# Patient Record
Sex: Male | Born: 1960 | Race: Black or African American | Hispanic: No | Marital: Single | Smoking: Former smoker
Health system: Southern US, Community
[De-identification: ages and names within clinical notes are randomized; demographics above are authoritative.]

## PROBLEM LIST (undated history)

## (undated) DIAGNOSIS — A4902 Methicillin resistant Staphylococcus aureus infection, unspecified site: Secondary | ICD-10-CM

## (undated) DIAGNOSIS — E119 Type 2 diabetes mellitus without complications: Secondary | ICD-10-CM

## (undated) DIAGNOSIS — M1990 Unspecified osteoarthritis, unspecified site: Secondary | ICD-10-CM

## (undated) DIAGNOSIS — I10 Essential (primary) hypertension: Secondary | ICD-10-CM

## (undated) DIAGNOSIS — D649 Anemia, unspecified: Secondary | ICD-10-CM

## (undated) HISTORY — PX: BUNIONECTOMY: SHX129

## (undated) HISTORY — PX: EYE SURGERY: SHX253

## (undated) HISTORY — PX: UMBILICAL HERNIA REPAIR: SHX196

## (undated) HISTORY — PX: AMPUTATION TOE: SHX6595

---

## 1997-11-02 ENCOUNTER — Emergency Department (HOSPITAL_COMMUNITY): Admission: EM | Admit: 1997-11-02 | Discharge: 1997-11-02 | Payer: Self-pay | Admitting: Emergency Medicine

## 1998-01-16 ENCOUNTER — Emergency Department (HOSPITAL_COMMUNITY): Admission: EM | Admit: 1998-01-16 | Discharge: 1998-01-16 | Payer: Self-pay | Admitting: Emergency Medicine

## 1998-01-17 ENCOUNTER — Inpatient Hospital Stay (HOSPITAL_COMMUNITY): Admission: EM | Admit: 1998-01-17 | Discharge: 1998-01-18 | Payer: Self-pay | Admitting: Emergency Medicine

## 1998-01-21 ENCOUNTER — Emergency Department (HOSPITAL_COMMUNITY): Admission: EM | Admit: 1998-01-21 | Discharge: 1998-01-21 | Payer: Self-pay | Admitting: Emergency Medicine

## 1998-02-21 ENCOUNTER — Emergency Department (HOSPITAL_COMMUNITY): Admission: EM | Admit: 1998-02-21 | Discharge: 1998-02-22 | Payer: Self-pay | Admitting: Emergency Medicine

## 1998-09-23 ENCOUNTER — Emergency Department (HOSPITAL_COMMUNITY): Admission: EM | Admit: 1998-09-23 | Discharge: 1998-09-23 | Payer: Self-pay | Admitting: Emergency Medicine

## 1999-05-06 ENCOUNTER — Emergency Department (HOSPITAL_COMMUNITY): Admission: EM | Admit: 1999-05-06 | Discharge: 1999-05-06 | Payer: Self-pay | Admitting: Emergency Medicine

## 1999-05-07 ENCOUNTER — Emergency Department (HOSPITAL_COMMUNITY): Admission: EM | Admit: 1999-05-07 | Discharge: 1999-05-07 | Payer: Self-pay | Admitting: Emergency Medicine

## 2008-03-05 ENCOUNTER — Emergency Department (HOSPITAL_COMMUNITY): Admission: EM | Admit: 2008-03-05 | Discharge: 2008-03-05 | Payer: Self-pay | Admitting: Emergency Medicine

## 2008-07-22 ENCOUNTER — Emergency Department (HOSPITAL_COMMUNITY): Admission: EM | Admit: 2008-07-22 | Discharge: 2008-07-22 | Payer: Self-pay | Admitting: Emergency Medicine

## 2008-09-16 ENCOUNTER — Emergency Department (HOSPITAL_COMMUNITY): Admission: EM | Admit: 2008-09-16 | Discharge: 2008-09-16 | Payer: Self-pay | Admitting: Emergency Medicine

## 2010-02-15 DEATH — deceased

## 2010-06-02 ENCOUNTER — Emergency Department (HOSPITAL_COMMUNITY): Admission: EM | Admit: 2010-06-02 | Discharge: 2010-06-03 | Payer: Self-pay | Admitting: Emergency Medicine

## 2010-09-28 LAB — ETHANOL: Alcohol, Ethyl (B): 226 mg/dL — ABNORMAL HIGH (ref 0–10)

## 2010-09-28 LAB — COMPREHENSIVE METABOLIC PANEL
ALT: 24 U/L (ref 0–53)
AST: 42 U/L — ABNORMAL HIGH (ref 0–37)
Albumin: 4.4 g/dL (ref 3.5–5.2)
Alkaline Phosphatase: 57 U/L (ref 39–117)
CO2: 20 mEq/L (ref 19–32)
Chloride: 105 mEq/L (ref 96–112)
Creatinine, Ser: 1.18 mg/dL (ref 0.4–1.5)
GFR calc Af Amer: >60 mL/min (ref >60)
GFR calc non Af Amer: >60 mL/min (ref >60)
Potassium: 3.9 mEq/L (ref 3.5–5.1)
Sodium: 140 mEq/L (ref 135–145)
Total Bilirubin: 0.3 mg/dL (ref 0.3–1.2)

## 2010-09-28 LAB — RAPID URINE DRUG SCREEN, HOSP PERFORMED
Amphetamines: NOT DETECTED
Cocaine: NOT DETECTED
Opiates: NOT DETECTED
Tetrahydrocannabinol: NOT DETECTED

## 2010-09-28 LAB — CBC
Hemoglobin: 13.6 g/dL (ref 13.0–17.0)
Platelets: 237 10*3/uL (ref 150–400)
RBC: 4.57 MIL/uL (ref 4.22–5.81)
WBC: 8.9 10*3/uL (ref 4.0–10.5)

## 2010-09-28 LAB — URINALYSIS, ROUTINE W REFLEX MICROSCOPIC
Bilirubin Urine: NEGATIVE
Hgb urine dipstick: NEGATIVE
Ketones, ur: NEGATIVE mg/dL
Specific Gravity, Urine: 1.009 (ref 1.005–1.030)
Urobilinogen, UA: 0.2 mg/dL (ref 0.0–1.0)

## 2010-09-28 LAB — DIFFERENTIAL
Basophils Absolute: 0 10*3/uL (ref 0.0–0.1)
Eosinophils Absolute: 0.1 10*3/uL (ref 0.0–0.7)
Eosinophils Relative: 1 % (ref 0–5)
Lymphocytes Relative: 16 % (ref 12–46)
Monocytes Absolute: 0.2 10*3/uL (ref 0.1–1.0)

## 2010-09-28 LAB — GLUCOSE, CAPILLARY: Glucose-Capillary: 69 mg/dL — ABNORMAL LOW (ref 70–99)

## 2010-10-28 LAB — CBC
HCT: 40.6 % (ref 39.0–52.0)
MCHC: 34.4 g/dL (ref 30.0–36.0)
MCV: 87.6 fL (ref 78.0–100.0)
Platelets: 237 10*3/uL (ref 150–400)
RDW: 14.3 % (ref 11.5–15.5)
WBC: 4.3 10*3/uL (ref 4.0–10.5)

## 2010-10-28 LAB — ETHANOL: Alcohol, Ethyl (B): 7 mg/dL (ref 0–10)

## 2010-10-28 LAB — RAPID URINE DRUG SCREEN, HOSP PERFORMED
Amphetamines: NOT DETECTED
Barbiturates: NOT DETECTED
Tetrahydrocannabinol: NOT DETECTED

## 2010-10-28 LAB — DIFFERENTIAL
Eosinophils Absolute: 0 10*3/uL (ref 0.0–0.7)
Eosinophils Relative: 1 % (ref 0–5)
Lymphs Abs: 1.3 10*3/uL (ref 0.7–4.0)

## 2010-10-28 LAB — BASIC METABOLIC PANEL
BUN: 6 mg/dL (ref 6–23)
CO2: 25 mEq/L (ref 19–32)
Chloride: 99 mEq/L (ref 96–112)
Glucose, Bld: 445 mg/dL — ABNORMAL HIGH (ref 70–99)
Potassium: 4.2 mEq/L (ref 3.5–5.1)

## 2010-10-28 LAB — GLUCOSE, CAPILLARY
Glucose-Capillary: 137 mg/dL — ABNORMAL HIGH (ref 70–99)
Glucose-Capillary: 332 mg/dL — ABNORMAL HIGH (ref 70–99)

## 2010-10-28 LAB — URINALYSIS, ROUTINE W REFLEX MICROSCOPIC
Leukocytes, UA: NEGATIVE
Nitrite: NEGATIVE
Specific Gravity, Urine: 1.012 (ref 1.005–1.030)
Urobilinogen, UA: 0.2 mg/dL (ref 0.0–1.0)

## 2010-10-28 LAB — URINE MICROSCOPIC-ADD ON

## 2010-11-01 LAB — DIFFERENTIAL
Eosinophils Relative: 2 % (ref 0–5)
Lymphocytes Relative: 24 % (ref 12–46)
Lymphs Abs: 1.5 10*3/uL (ref 0.7–4.0)
Monocytes Absolute: 0.4 10*3/uL (ref 0.1–1.0)

## 2010-11-01 LAB — BASIC METABOLIC PANEL
BUN: 21 mg/dL (ref 6–23)
Calcium: 9.6 mg/dL (ref 8.4–10.5)
GFR calc non Af Amer: >60 mL/min (ref >60)
Glucose, Bld: 162 mg/dL — ABNORMAL HIGH (ref 70–99)
Sodium: 134 mEq/L — ABNORMAL LOW (ref 135–145)

## 2010-11-01 LAB — CBC
HCT: 38.1 % — ABNORMAL LOW (ref 39.0–52.0)
Hemoglobin: 12.5 g/dL — ABNORMAL LOW (ref 13.0–17.0)
MCV: 89.6 fL (ref 78.0–100.0)
WBC: 6.2 10*3/uL (ref 4.0–10.5)

## 2010-11-01 LAB — POCT CARDIAC MARKERS: Troponin i, poc: <0.05 ng/mL (ref 0.00–0.09)

## 2010-11-11 ENCOUNTER — Emergency Department (HOSPITAL_COMMUNITY)
Admission: EM | Admit: 2010-11-11 | Discharge: 2010-11-11 | Disposition: A | Discharge status: Home / Self Care | Payer: Medicaid Other | Attending: Emergency Medicine | Admitting: Emergency Medicine

## 2010-11-11 ENCOUNTER — Emergency Department (HOSPITAL_COMMUNITY): Payer: Medicaid Other

## 2010-11-11 DIAGNOSIS — R112 Nausea with vomiting, unspecified: Secondary | ICD-10-CM | POA: Insufficient documentation

## 2010-11-11 DIAGNOSIS — I498 Other specified cardiac arrhythmias: Secondary | ICD-10-CM | POA: Insufficient documentation

## 2010-11-11 DIAGNOSIS — R05 Cough: Secondary | ICD-10-CM | POA: Insufficient documentation

## 2010-11-11 DIAGNOSIS — E119 Type 2 diabetes mellitus without complications: Secondary | ICD-10-CM | POA: Insufficient documentation

## 2010-11-11 DIAGNOSIS — I10 Essential (primary) hypertension: Secondary | ICD-10-CM | POA: Insufficient documentation

## 2010-11-11 DIAGNOSIS — J40 Bronchitis, not specified as acute or chronic: Secondary | ICD-10-CM | POA: Insufficient documentation

## 2010-11-11 DIAGNOSIS — J329 Chronic sinusitis, unspecified: Secondary | ICD-10-CM | POA: Insufficient documentation

## 2010-11-11 DIAGNOSIS — R059 Cough, unspecified: Secondary | ICD-10-CM | POA: Insufficient documentation

## 2010-11-11 LAB — GLUCOSE, CAPILLARY: Glucose-Capillary: 66 mg/dL — ABNORMAL LOW (ref 70–99)

## 2010-11-23 ENCOUNTER — Emergency Department (HOSPITAL_COMMUNITY)
Admission: EM | Admit: 2010-11-23 | Discharge: 2010-11-23 | Discharge status: Against Medical Advice | Payer: Medicaid Other | Attending: Emergency Medicine | Admitting: Emergency Medicine

## 2010-11-23 DIAGNOSIS — I10 Essential (primary) hypertension: Secondary | ICD-10-CM | POA: Insufficient documentation

## 2010-11-23 DIAGNOSIS — E119 Type 2 diabetes mellitus without complications: Secondary | ICD-10-CM | POA: Insufficient documentation

## 2010-11-23 DIAGNOSIS — Z7982 Long term (current) use of aspirin: Secondary | ICD-10-CM | POA: Insufficient documentation

## 2010-11-23 DIAGNOSIS — Z79899 Other long term (current) drug therapy: Secondary | ICD-10-CM | POA: Insufficient documentation

## 2010-11-23 DIAGNOSIS — F1010 Alcohol abuse, uncomplicated: Secondary | ICD-10-CM | POA: Insufficient documentation

## 2010-11-23 LAB — CBC
HCT: 36 % — ABNORMAL LOW (ref 39.0–52.0)
Hemoglobin: 12.3 g/dL — ABNORMAL LOW (ref 13.0–17.0)
RBC: 4.21 MIL/uL — ABNORMAL LOW (ref 4.22–5.81)
RDW: 14.5 % (ref 11.5–15.5)
WBC: 4.4 10*3/uL (ref 4.0–10.5)

## 2010-11-23 LAB — BASIC METABOLIC PANEL
CO2: 28 mEq/L (ref 19–32)
Calcium: 10 mg/dL (ref 8.4–10.5)
GFR calc Af Amer: >60 mL/min (ref >60)
Sodium: 139 mEq/L (ref 135–145)

## 2010-11-23 LAB — DIFFERENTIAL
Basophils Absolute: 0 10*3/uL (ref 0.0–0.1)
Eosinophils Relative: 2 % (ref 0–5)
Lymphocytes Relative: 36 % (ref 12–46)
Neutro Abs: 2.4 10*3/uL (ref 1.7–7.7)
Neutrophils Relative %: 55 % (ref 43–77)

## 2011-02-14 ENCOUNTER — Emergency Department (HOSPITAL_COMMUNITY): Payer: Medicaid Other

## 2011-02-14 ENCOUNTER — Emergency Department (HOSPITAL_COMMUNITY)
Admission: EM | Admit: 2011-02-14 | Discharge: 2011-02-14 | Disposition: A | Discharge status: Home / Self Care | Payer: Medicaid Other | Attending: Emergency Medicine | Admitting: Emergency Medicine

## 2011-02-14 DIAGNOSIS — E119 Type 2 diabetes mellitus without complications: Secondary | ICD-10-CM | POA: Insufficient documentation

## 2011-02-14 DIAGNOSIS — Z79899 Other long term (current) drug therapy: Secondary | ICD-10-CM | POA: Insufficient documentation

## 2011-02-14 DIAGNOSIS — I10 Essential (primary) hypertension: Secondary | ICD-10-CM | POA: Insufficient documentation

## 2011-02-14 DIAGNOSIS — R05 Cough: Secondary | ICD-10-CM | POA: Insufficient documentation

## 2011-02-14 DIAGNOSIS — R059 Cough, unspecified: Secondary | ICD-10-CM | POA: Insufficient documentation

## 2011-03-25 ENCOUNTER — Inpatient Hospital Stay (INDEPENDENT_AMBULATORY_CARE_PROVIDER_SITE_OTHER)
Admission: RE | Admit: 2011-03-25 | Discharge: 2011-03-25 | Disposition: A | Discharge status: Home / Self Care | Payer: Medicaid Other | Source: Ambulatory Visit | Attending: Emergency Medicine | Admitting: Emergency Medicine

## 2011-03-25 DIAGNOSIS — L03019 Cellulitis of unspecified finger: Secondary | ICD-10-CM

## 2011-03-28 LAB — CULTURE, ROUTINE-ABSCESS

## 2011-05-15 ENCOUNTER — Emergency Department (HOSPITAL_COMMUNITY)
Admission: EM | Admit: 2011-05-15 | Discharge: 2011-05-16 | Disposition: A | Discharge status: Home / Self Care | Payer: Medicaid Other | Attending: Emergency Medicine | Admitting: Emergency Medicine

## 2011-05-15 DIAGNOSIS — E119 Type 2 diabetes mellitus without complications: Secondary | ICD-10-CM | POA: Insufficient documentation

## 2011-05-15 DIAGNOSIS — F329 Major depressive disorder, single episode, unspecified: Secondary | ICD-10-CM | POA: Insufficient documentation

## 2011-05-15 DIAGNOSIS — F1020 Alcohol dependence, uncomplicated: Secondary | ICD-10-CM | POA: Insufficient documentation

## 2011-05-15 DIAGNOSIS — Z79899 Other long term (current) drug therapy: Secondary | ICD-10-CM | POA: Insufficient documentation

## 2011-05-15 DIAGNOSIS — I10 Essential (primary) hypertension: Secondary | ICD-10-CM | POA: Insufficient documentation

## 2011-05-15 DIAGNOSIS — Z7982 Long term (current) use of aspirin: Secondary | ICD-10-CM | POA: Insufficient documentation

## 2011-05-15 DIAGNOSIS — F3289 Other specified depressive episodes: Secondary | ICD-10-CM | POA: Insufficient documentation

## 2011-05-15 DIAGNOSIS — F1410 Cocaine abuse, uncomplicated: Secondary | ICD-10-CM | POA: Insufficient documentation

## 2011-05-15 LAB — RAPID URINE DRUG SCREEN, HOSP PERFORMED
Amphetamines: NOT DETECTED
Barbiturates: NOT DETECTED
Tetrahydrocannabinol: NOT DETECTED

## 2011-05-15 LAB — CBC
HCT: 37.9 % — ABNORMAL LOW (ref 39.0–52.0)
Hemoglobin: 13 g/dL (ref 13.0–17.0)
MCH: 28.6 pg (ref 26.0–34.0)
MCV: 83.5 fL (ref 78.0–100.0)
RBC: 4.54 MIL/uL (ref 4.22–5.81)

## 2011-05-15 LAB — COMPREHENSIVE METABOLIC PANEL
ALT: 15 U/L (ref 0–53)
AST: 25 U/L (ref 0–37)
Calcium: 10.4 mg/dL (ref 8.4–10.5)
GFR calc Af Amer: 84 mL/min — ABNORMAL LOW (ref >90)
Sodium: 138 mEq/L (ref 135–145)
Total Protein: 8 g/dL (ref 6.0–8.3)

## 2011-05-15 LAB — DIFFERENTIAL
Eosinophils Absolute: 0.1 10*3/uL (ref 0.0–0.7)
Lymphs Abs: 1.6 10*3/uL (ref 0.7–4.0)
Monocytes Relative: 6 % (ref 3–12)
Neutro Abs: 3.3 10*3/uL (ref 1.7–7.7)
Neutrophils Relative %: 62 % (ref 43–77)

## 2011-05-15 LAB — ETHANOL: Alcohol, Ethyl (B): 177 mg/dL — ABNORMAL HIGH (ref 0–11)

## 2011-05-16 ENCOUNTER — Inpatient Hospital Stay (HOSPITAL_COMMUNITY): Admission: AD | Admit: 2011-05-16 | Payer: Medicaid Other | Source: Ambulatory Visit | Admitting: Psychiatry

## 2011-05-16 LAB — GLUCOSE, CAPILLARY: Glucose-Capillary: 119 mg/dL — ABNORMAL HIGH (ref 70–99)

## 2011-05-17 ENCOUNTER — Emergency Department (HOSPITAL_COMMUNITY)
Admission: EM | Admit: 2011-05-17 | Discharge: 2011-05-17 | Disposition: A | Discharge status: Home / Self Care | Payer: Medicaid Other | Attending: Emergency Medicine | Admitting: Emergency Medicine

## 2011-05-17 DIAGNOSIS — E119 Type 2 diabetes mellitus without complications: Secondary | ICD-10-CM | POA: Insufficient documentation

## 2011-05-17 DIAGNOSIS — I10 Essential (primary) hypertension: Secondary | ICD-10-CM | POA: Insufficient documentation

## 2011-05-17 DIAGNOSIS — F1010 Alcohol abuse, uncomplicated: Secondary | ICD-10-CM | POA: Insufficient documentation

## 2011-05-17 LAB — POCT I-STAT, CHEM 8
BUN: 8 mg/dL (ref 6–23)
Creatinine, Ser: 1.4 mg/dL — ABNORMAL HIGH (ref 0.50–1.35)
Glucose, Bld: 258 mg/dL — ABNORMAL HIGH (ref 70–99)
Hemoglobin: 13.3 g/dL (ref 13.0–17.0)
Potassium: 4.2 mEq/L (ref 3.5–5.1)

## 2011-05-17 LAB — GLUCOSE, CAPILLARY: Glucose-Capillary: 231 mg/dL — ABNORMAL HIGH (ref 70–99)

## 2020-02-10 ENCOUNTER — Ambulatory Visit: Payer: No Typology Code available for payment source | Admitting: Allergy

## 2020-03-10 NOTE — Progress Notes (Signed)
New Patient Note  RE: RALPHEAL Figueroa MRN: [id number] DOB: [birth year] Date of Office Visit: 03/11/2020  Referring provider: Pecolia Ades, MD Primary care provider: [facility]  Chief Complaint: Allergic Rhinitis   History of Present Illness: I had the pleasure of seeing Conner Muegge for initial evaluation at the [facility] on 03/11/2020. He is a 59 y.o. male, who is referred here by [facility] for the evaluation of allergic rhinitis.  He reports symptoms of sneezing, coughing, rhinorrhea, voice hoarseness, itchy eyes. Symptoms have been going on for many years but worse the past year. The symptoms are present all year around with worsening in spring. Anosmia: no. Headache: no. He has used zyrtec 10mg  daily, prescription nasal spray 1 spray BID with some improvement in symptoms. No nosebleeds. Sinus infections: no. Previous work up includes: Blood work was positive to multiple items per patient report. Previous ENT evaluation: no. Previous sinus imaging: no. History of nasal polyps: no. Last eye exam: 3 months ago - patient gets injections for glaucoma. History of reflux: no.  Assessment and Plan: Cashton is a 59 y.o. male with: Other allergic rhinitis Perennial rhinoconjunctivitis symptoms for many years but worse the last year and during the spring.  Currently on Zyrtec 10 mg daily and some type of prescription nasal spray twice a day with some benefit.  No previous ENT evaluation.  He had blood work drawn which showed multiple allergens per patient's report.  Patient follows with ophthalmology for glaucoma injections.  Today's skin testing showed positive to grass, weed, ragweed, trees, mold, dust mites, dog.  Start environmental control measures as below.  May use over the counter antihistamines such as Zyrtec (cetirizine) 10mg  daily.   Had a detailed discussion with patient/family that clinical history is suggestive of allergic rhinitis,  and may benefit from allergy immunotherapy (AIT). Discussed in detail regarding the dosing, schedule, side effects (mild to moderate local allergic reaction and rarely systemic allergic reactions including anaphylaxis), and benefits (significant improvement in nasal symptoms, seasonal flares of asthma) of immunotherapy with the patient. There is significant time commitment involved with allergy shots, which includes weekly immunotherapy injections for first 9-12 months and then biweekly to monthly injections for 3-5 years. Consent was signed.   Start allergy injections.  I have prescribed epinephrine injectable and demonstrated proper use. For mild symptoms you can take over the counter antihistamines such as Benadryl and monitor symptoms closely. If symptoms worsen or if you have severe symptoms including breathing issues, throat closure, significant swelling, whole body hives, severe diarrhea and vomiting, lightheadedness then inject epinephrine and seek immediate medical care afterwards.  Advised patient to call with what nasal spray he is using at home. Given his glaucoma will avoid steroid nasal sprays.  Allergic conjunctivitis of both eyes  See assessment and plan as above for allergic rhinitis.  Return in about 6 months (around 09/11/2020).  Meds ordered this encounter  Medications  . EPINEPHrine 0.3 mg/0.3 mL IJ SOAJ injection    Sig: Inject 0.3 mLs (0.3 mg total) into the muscle as needed for anaphylaxis.    Dispense:  1 each    Refill:  2   Other allergy screening: Asthma: no Food allergy: no  Had issues with seafood and shellfish 8 years ago which caused pruritus.  Currently tolerating with no issues. Medication allergy: yes Hymenoptera allergy: no Urticaria: no Eczema:no History of recurrent infections suggestive of immunodeficency: no  Diagnostics: Skin Testing: Environmental allergy panel. Positive  to grass, weed, ragweed, trees, mold, dust mites, dog. Results discussed  with patient/family.  Airborne Adult Perc - 03/11/20 0939    Time Antigen Placed 3295    Allergen Manufacturer Waynette Buttery    Location Back    Number of Test 59    Panel 1 Select    1. Control-Buffer 50% Glycerol Negative    2. Control-Histamine 1 mg/ml 2+    3. Albumin saline Negative    4. Bahia 3+    5. [place] 4+    6. Johnson 4+    7. Kentucky Blue 3+    8. Meadow Fescue 3+    9. Perennial Rye 4+    10. Sweet Vernal 4+    11. Timothy 4+    12. Cocklebur Negative    13. Burweed Marshelder 2+    14. Ragweed, short 3+    15. Ragweed, Giant 2+    16. Plantain,  English 2+    17. Lamb's Quarters 2+    18. Sheep Sorrell 2+    19. Rough Pigweed 2+    20. Marsh Elder, Rough Negative    21. Mugwort, Common 3+    22. Ash mix 3+    23. Birch mix 2+    24. Beech American 3+    25. Box, Elder 2+    26. Cedar, red 4+    27. Cottonwood, Eastern 2+    28. Elm mix 3+    29. Hickory Negative    30. Maple mix Negative    31. Oak, Guinea-[place]issau mix 3+    32. Pecan Pollen 4+    33. Pine mix 4+    34. Sycamore Eastern 4+    35. Walnut, Black Pollen 4+    36. Alternaria alternata Negative    37. Cladosporium Herbarum Negative    38. Aspergillus mix Negative    39. Penicillium mix Negative    40. Bipolaris sorokiniana (Helminthosporium) Negative    41. Drechslera spicifera (Curvularia) 2+    42. Mucor plumbeus Negative    43. Fusarium moniliforme 2+    44. Aureobasidium pullulans (pullulara) Negative    45. Rhizopus oryzae 2+    46. Botrytis cinera Negative    47. Epicoccum nigrum Negative    48. Phoma betae Negative    49. Candida Albicans Negative    50. Trichophyton mentagrophytes Negative    51. Mite, D Farinae  5,000 AU/ml 2+    52. Mite, D Pteronyssinus  5,000 AU/ml 2+    53. Cat Hair 10,000 BAU/ml Negative    54.  Dog Epithelia 2+    55. Mixed Feathers Negative    56. Horse Epithelia Negative    57. Cockroach, German Negative    58. Mouse Negative    59. Tobacco Leaf Negative           Intradermal - 03/11/20 1014    Time Antigen Placed 1014    Allergen Manufacturer Waynette Buttery    Location Arm    Number of Test 5    Control Negative    Mold 1 2+    Mold 2 Negative    Cat Negative    Cockroach Negative           Past Medical History: Patient Active Problem List   Diagnosis Date Noted  . Other allergic rhinitis 03/11/2020  . Allergic conjunctivitis of both eyes 03/11/2020   History reviewed. No pertinent past medical history. Past Surgical History: Past Surgical History:  Procedure Laterality Date  .  BUNIONECTOMY    . EYE SURGERY    . UMBILICAL HERNIA REPAIR     Medication List:  Current Outpatient Medications  Medication Sig Dispense Refill  . cetirizine (ZYRTEC) 10 MG tablet Take 10 mg by mouth daily.    Marland Kitchen. EPINEPHrine 0.3 mg/0.3 mL IJ SOAJ injection Inject 0.3 mLs (0.3 mg total) into the muscle as needed for anaphylaxis. 1 each 2   No current facility-administered medications for this visit.   Allergies: Allergies  Allergen Reactions  . Codeine Itching   Social History: Social History   Socioeconomic History  . Marital status: Single    Spouse name: Not on file  . Number of children: Not on file  . Years of education: Not on file  . Highest education level: Not on file  Occupational History  . Not on file  Tobacco Use  . Smoking status: Former Games developermoker  . Smokeless tobacco: Never Used  Vaping Use  . Vaping Use: Never used  Substance and Sexual Activity  . Alcohol use: Not Currently  . Drug use: Never  . Sexual activity: Not on file  Other Topics Concern  . Not on file  Social History Narrative  . Not on file   Social Determinants of Health   Financial Resource Strain:   . Difficulty of Paying Living Expenses: Not on file  Food Insecurity:   . Worried About Programme researcher, broadcasting/film/videounning Out of Food in the Last Year: Not on file  . Ran Out of Food in the Last Year: Not on file  Transportation Needs:   . Lack of Transportation (Medical): Not on  file  . Lack of Transportation (Non-Medical): Not on file  Physical Activity:   . Days of Exercise per Week: Not on file  . Minutes of Exercise per Session: Not on file  Stress:   . Feeling of Stress : Not on file  Social Connections:   . Frequency of Communication with Friends and Family: Not on file  . Frequency of Social Gatherings with Friends and Family: Not on file  . Attends Religious Services: Not on file  . Active Member of Clubs or Organizations: Not on file  . Attends BankerClub or Organization Meetings: Not on file  . Marital Status: Not on file   Lives in a 6[address] yea old home. Smoking: quit 8 years ago Occupation: retired  Landscape architectnvironmental History: ImmunologistWater Damage/mildew in the house: no Engineer, civil (consulting)Carpet in the family room: yes Carpet in the bedroom: yes Heating: electric Cooling: central Pet: no  Family History: History reviewed. No pertinent family history. Problem                               Relation Asthma                                   Mother  Eczema                                No  Food allergy                          No  Allergic rhino conjunctivitis     Mother, brother, niece  Review of Systems  Constitutional: Negative for appetite change, chills, fever and unexpected weight change.  HENT: Positive for  congestion, postnasal drip and rhinorrhea.   Eyes: Positive for itching.  Respiratory: Positive for cough. Negative for chest tightness, shortness of breath and wheezing.   Cardiovascular: Negative for chest pain.  Gastrointestinal: Negative for abdominal pain.  Genitourinary: Negative for difficulty urinating.  Skin: Negative for rash.  Allergic/Immunologic: Positive for environmental allergies.  Neurological: Negative for headaches.   Objective: Pulse 98   Temp 98 F (36.7 C) (Temporal)   Resp 16   Ht 6\' 1"  (1.854 m)   Wt 185 lb 6.4 oz (84.1 kg)   SpO2 97%   BMI 24.46 kg/m  Body mass index is 24.46 kg/m. Physical Exam Vitals and nursing note reviewed.   Constitutional:      Appearance: Normal appearance. He is well-developed.  HENT:     Head: Normocephalic and atraumatic.     Right Ear: External ear normal.     Left Ear: External ear normal. There is impacted cerumen.     Nose: Nose normal.     Mouth/Throat:     Mouth: Mucous membranes are moist.     Pharynx: Oropharynx is clear.  Eyes:     Conjunctiva/sclera: Conjunctivae normal.  Cardiovascular:     Rate and Rhythm: Normal rate and regular rhythm.     Heart sounds: Normal heart sounds. No murmur heard.  No friction rub. No gallop.   Pulmonary:     Effort: Pulmonary effort is normal.     Breath sounds: Normal breath sounds. No wheezing, rhonchi or rales.  Abdominal:     Palpations: Abdomen is soft.  Musculoskeletal:     Cervical back: Neck supple.  Skin:    General: Skin is warm.     Findings: No rash.  Neurological:     Mental Status: He is alert and oriented to person, place, and time.  Psychiatric:        Behavior: Behavior normal.    The plan was reviewed with the patient/family, and all questions/concerned were addressed.  It was my pleasure to see Blair today and participate in his care. Please feel free to contact me with any questions or concerns.  Sincerely,  Tinnie Gens, DO Allergy & Immunology  [facility] office: [phone] [facility] office: [phone] [place] office: [phone]

## 2020-03-11 ENCOUNTER — Ambulatory Visit (INDEPENDENT_AMBULATORY_CARE_PROVIDER_SITE_OTHER): Payer: No Typology Code available for payment source | Admitting: Allergy

## 2020-03-11 ENCOUNTER — Encounter: Payer: Self-pay | Admitting: Allergy

## 2020-03-11 ENCOUNTER — Other Ambulatory Visit: Payer: Self-pay

## 2020-03-11 ENCOUNTER — Telehealth: Payer: Self-pay | Admitting: Allergy

## 2020-03-11 VITALS — HR 98 | Temp 98.0°F | Resp 16 | Ht 73.0 in | Wt 185.4 lb

## 2020-03-11 DIAGNOSIS — H1013 Acute atopic conjunctivitis, bilateral: Secondary | ICD-10-CM | POA: Diagnosis not present

## 2020-03-11 DIAGNOSIS — J3089 Other allergic rhinitis: Secondary | ICD-10-CM | POA: Diagnosis not present

## 2020-03-11 MED ORDER — EPINEPHRINE 0.3 MG/0.3ML IJ SOAJ: 0.3000 mg | INTRAMUSCULAR | 2 refills | Status: DC | PRN

## 2020-03-11 NOTE — Assessment & Plan Note (Addendum)
Perennial rhinoconjunctivitis symptoms for many years but worse the last year and during the spring.  Currently on Zyrtec 10 mg daily and some type of prescription nasal spray twice a day with some benefit.  No previous ENT evaluation.  He had blood work drawn which showed multiple allergens per patient's report.  Patient follows with ophthalmology for glaucoma injections.  Today's skin testing showed positive to grass, weed, ragweed, trees, mold, dust mites, dog.  Start environmental control measures as below.  May use over the counter antihistamines such as Zyrtec (cetirizine) 10mg  daily.   Had a detailed discussion with patient/family that clinical history is suggestive of allergic rhinitis, and may benefit from allergy immunotherapy (AIT). Discussed in detail regarding the dosing, schedule, side effects (mild to moderate local allergic reaction and rarely systemic allergic reactions including anaphylaxis), and benefits (significant improvement in nasal symptoms, seasonal flares of asthma) of immunotherapy with the patient. There is significant time commitment involved with allergy shots, which includes weekly immunotherapy injections for first 9-12 months and then biweekly to monthly injections for 3-5 years. Consent was signed.   Start allergy injections.  I have prescribed epinephrine injectable and demonstrated proper use. For mild symptoms you can take over the counter antihistamines such as Benadryl and monitor symptoms closely. If symptoms worsen or if you have severe symptoms including breathing issues, throat closure, significant swelling, whole body hives, severe diarrhea and vomiting, lightheadedness then inject epinephrine and seek immediate medical care afterwards.  Advised patient to call with what nasal spray he is using at home. Given his glaucoma will avoid steroid nasal sprays.

## 2020-03-11 NOTE — Telephone Encounter (Signed)
Patient called back to let you know that he is taking ipratropium nasal  spray. [phone]

## 2020-03-11 NOTE — Progress Notes (Signed)
VIALS EXP 03-11-21 °

## 2020-03-11 NOTE — Assessment & Plan Note (Signed)
   See assessment and plan as above for allergic rhinitis.  

## 2020-03-11 NOTE — Patient Instructions (Addendum)
Let me know what nasal spray you use at home.   Today's skin testing showed: Positive to grass, weed, ragweed, trees, mold, dust mites, dog.  Environmental allergies  Start environmental control measures as below.  May use over the counter antihistamines such as Zyrtec (cetirizine) 10mg  daily.   Had a detailed discussion with patient/family that clinical history is suggestive of allergic rhinitis, and may benefit from allergy immunotherapy (AIT). Discussed in detail regarding the dosing, schedule, side effects (mild to moderate local allergic reaction and rarely systemic allergic reactions including anaphylaxis), and benefits (significant improvement in nasal symptoms, seasonal flares of asthma) of immunotherapy with the patient. There is significant time commitment involved with allergy shots, which includes weekly immunotherapy injections for first 9-12 months and then biweekly to monthly injections for 3-5 years. Consent was signed.   Start allergy injections.  I have prescribed epinephrine injectable and demonstrated proper use. For mild symptoms you can take over the counter antihistamines such as Benadryl and monitor symptoms closely. If symptoms worsen or if you have severe symptoms including breathing issues, throat closure, significant swelling, whole body hives, severe diarrhea and vomiting, lightheadedness then inject epinephrine and seek immediate medical care afterwards.  Follow up in 6 months or sooner if needed.  Follow up in 3 weeks for first injection.  [place] office: [phone]  Reducing Pollen Exposure . Pollen seasons: trees (spring), grass (summer) and ragweed/weeds (fall). 11[birth year]-1980 Keep windows closed in your home and car to lower pollen exposure.  Marland Kitchen air conditioning in the bedroom and throughout the house if possible.  . Avoid going out in dry windy days - especially early morning. . Pollen counts are highest between 5 - 10 AM and on dry, hot and windy days.   . Save outside activities for late afternoon or after a heavy rain, when pollen levels are lower.  . Avoid mowing of grass if you have grass pollen allergy. Lilian Kapur Be aware that pollen can also be transported indoors on people and pets.  . Dry your clothes in an automatic dryer rather than hanging them outside where they might collect pollen.  . Rinse hair and eyes before bedtime. Mold Control . Mold and fungi can grow on a variety of surfaces provided certain temperature and moisture conditions exist.  . Outdoor molds grow on plants, decaying vegetation and soil. The major outdoor mold, Alternaria and Cladosporium, are found in very high numbers during hot and dry conditions. Generally, a late summer - fall peak is seen for common outdoor fungal spores. Rain will temporarily lower outdoor mold spore count, but counts rise rapidly when the rainy period ends. . The most important indoor molds are Aspergillus and Penicillium. Dark, humid and poorly ventilated basements are ideal sites for mold growth. The next most common sites of mold growth are the bathroom and the kitchen. Outdoor (Seasonal) Mold Control . Use air conditioning and keep windows closed. . Avoid exposure to decaying vegetation. 05-[birth year]-1976 Avoid leaf raking. . Avoid grain handling. . Consider wearing a face mask if working in moldy areas.  Indoor (Perennial) Mold Control  . Maintain humidity below 50%. . Get rid of mold growth on hard surfaces with water, detergent and, if necessary, 5% bleach (do not mix with other cleaners). Then dry the area completely. If mold covers an area more than 10 square feet, consider hiring an indoor environmental professional. . For clothing, washing with soap and water is best. If moldy items cannot be cleaned and dried, throw them away. Marland Kitchen  Remove sources e.g. contaminated carpets. . Repair and seal leaking roofs or pipes. Using dehumidifiers in damp basements may be helpful, but empty the water and clean units  regularly to prevent mildew from forming. All rooms, especially basements, bathrooms and kitchens, require ventilation and cleaning to deter mold and mildew growth. Avoid carpeting on concrete or damp floors, and storing items in damp areas. Control of House Dust Mite Allergen . Dust mite allergens are a common trigger of allergy and asthma symptoms. While they can be found throughout the house, these microscopic creatures thrive in warm, humid environments such as bedding, upholstered furniture and carpeting. . Because so much time is spent in the bedroom, it is essential to reduce mite levels there.  . Encase pillows, mattresses, and box springs in special allergen-proof fabric covers or airtight, zippered plastic covers.  . Bedding should be washed weekly in hot water (130 F) and dried in a hot dryer. Allergen-proof covers are available for comforters and pillows that can't be regularly washed.  Reyes Ivan the allergy-proof covers every few months. Minimize clutter in the bedroom. Keep pets out of the bedroom.  Marland Kitchen Keep humidity less than 50% by using a dehumidifier or air conditioning. You can buy a humidity measuring device called a hygrometer to monitor this.  . If possible, replace carpets with hardwood, linoleum, or washable area rugs. If that's not possible, vacuum frequently with a vacuum that has a HEPA filter. . Remove all upholstered furniture and non-washable window drapes from the bedroom. . Remove all non-washable stuffed toys from the bedroom.  Wash stuffed toys weekly. Pet Allergen Avoidance: . Contrary to popular opinion, there are no "hypoallergenic" breeds of dogs or cats. That is because people are not allergic to an animal's hair, but to an allergen found in the animal's saliva, dander (dead skin flakes) or urine. Pet allergy symptoms typically occur within minutes. For some people, symptoms can build up and become most severe 8 to 12 hours after contact with the animal. People with  severe allergies can experience reactions in public places if dander has been transported on the pet owners' clothing. Marland Kitchen Keeping an animal outdoors is only a partial solution, since homes with pets in the yard still have higher concentrations of animal allergens. . Before getting a pet, ask your allergist to determine if you are allergic to animals. If your pet is already considered part of your family, try to minimize contact and keep the pet out of the bedroom and other rooms where you spend a great deal of time. . As with dust mites, vacuum carpets often or replace carpet with a hardwood floor, tile or linoleum. . High-efficiency particulate air (HEPA) cleaners can reduce allergen levels over time. . While dander and saliva are the source of cat and dog allergens, urine is the source of allergens from rabbits, hamsters, mice and [place] pigs; so ask a non-allergic family member to clean the animal's cage. . If you have a pet allergy, talk to your allergist about the potential for allergy immunotherapy (allergy shots). This strategy can often provide long-term relief.

## 2020-03-11 NOTE — Telephone Encounter (Signed)
Was unable to communicate attempted several times, 1st time disconnected, 2nd  time number not in service, 3rd time disconnected.

## 2020-03-12 DIAGNOSIS — J301 Allergic rhinitis due to pollen: Secondary | ICD-10-CM | POA: Diagnosis not present

## 2020-03-12 NOTE — Telephone Encounter (Signed)
Called and spoke with patient and he wanted to just let Dr. Selena Batten know that he was taking ipratropium nasal spray.

## 2020-03-12 NOTE — Telephone Encounter (Signed)
Okay that's fine to take as prescribed.

## 2020-03-16 DIAGNOSIS — J3089 Other allergic rhinitis: Secondary | ICD-10-CM

## 2020-04-01 ENCOUNTER — Ambulatory Visit (INDEPENDENT_AMBULATORY_CARE_PROVIDER_SITE_OTHER): Payer: No Typology Code available for payment source | Admitting: *Deleted

## 2020-04-01 ENCOUNTER — Other Ambulatory Visit: Payer: Self-pay

## 2020-04-01 DIAGNOSIS — J309 Allergic rhinitis, unspecified: Secondary | ICD-10-CM | POA: Diagnosis not present

## 2020-04-01 NOTE — Progress Notes (Signed)
Immunotherapy   Patient Details  Name: Steven Figueroa MRN: [id number] Date of Birth: 19-Sep-1960  04/01/2020  Steven Figueroa started injections for  G-W-RW_T, M-DM-D Following schedule: A  Frequency:1 time per week Epi-Pen:Epi-Pen Available  Consent signed and patient instructions given. Patient started allergy injections and receive .65mL of G-W-RW-T in the RUA and 0.7mL of M-DM-D in the LUA. Patient waited 30 minutes and did not experience any issues.    Kirby Cortese Fernandez-Vernon 04/01/2020, 11:05 AM

## 2020-04-07 ENCOUNTER — Ambulatory Visit (INDEPENDENT_AMBULATORY_CARE_PROVIDER_SITE_OTHER): Payer: No Typology Code available for payment source | Admitting: *Deleted

## 2020-04-07 DIAGNOSIS — J309 Allergic rhinitis, unspecified: Secondary | ICD-10-CM

## 2020-04-15 ENCOUNTER — Ambulatory Visit (INDEPENDENT_AMBULATORY_CARE_PROVIDER_SITE_OTHER): Payer: No Typology Code available for payment source

## 2020-04-15 DIAGNOSIS — J309 Allergic rhinitis, unspecified: Secondary | ICD-10-CM

## 2020-04-21 ENCOUNTER — Ambulatory Visit (INDEPENDENT_AMBULATORY_CARE_PROVIDER_SITE_OTHER): Payer: No Typology Code available for payment source

## 2020-04-21 DIAGNOSIS — J309 Allergic rhinitis, unspecified: Secondary | ICD-10-CM

## 2020-04-28 ENCOUNTER — Ambulatory Visit (INDEPENDENT_AMBULATORY_CARE_PROVIDER_SITE_OTHER): Payer: No Typology Code available for payment source

## 2020-04-28 DIAGNOSIS — J309 Allergic rhinitis, unspecified: Secondary | ICD-10-CM | POA: Diagnosis not present

## 2020-05-06 ENCOUNTER — Ambulatory Visit (INDEPENDENT_AMBULATORY_CARE_PROVIDER_SITE_OTHER): Payer: No Typology Code available for payment source | Admitting: *Deleted

## 2020-05-06 DIAGNOSIS — J309 Allergic rhinitis, unspecified: Secondary | ICD-10-CM

## 2020-05-14 ENCOUNTER — Ambulatory Visit (INDEPENDENT_AMBULATORY_CARE_PROVIDER_SITE_OTHER): Payer: No Typology Code available for payment source

## 2020-05-14 DIAGNOSIS — J309 Allergic rhinitis, unspecified: Secondary | ICD-10-CM

## 2020-05-29 ENCOUNTER — Ambulatory Visit (INDEPENDENT_AMBULATORY_CARE_PROVIDER_SITE_OTHER): Payer: No Typology Code available for payment source

## 2020-05-29 DIAGNOSIS — J309 Allergic rhinitis, unspecified: Secondary | ICD-10-CM | POA: Diagnosis not present

## 2020-06-02 ENCOUNTER — Ambulatory Visit (INDEPENDENT_AMBULATORY_CARE_PROVIDER_SITE_OTHER): Payer: No Typology Code available for payment source

## 2020-06-02 DIAGNOSIS — J309 Allergic rhinitis, unspecified: Secondary | ICD-10-CM

## 2020-06-09 ENCOUNTER — Ambulatory Visit (INDEPENDENT_AMBULATORY_CARE_PROVIDER_SITE_OTHER): Payer: No Typology Code available for payment source | Admitting: *Deleted

## 2020-06-09 DIAGNOSIS — J309 Allergic rhinitis, unspecified: Secondary | ICD-10-CM

## 2020-06-15 ENCOUNTER — Ambulatory Visit (INDEPENDENT_AMBULATORY_CARE_PROVIDER_SITE_OTHER): Payer: No Typology Code available for payment source | Admitting: *Deleted

## 2020-06-15 DIAGNOSIS — J309 Allergic rhinitis, unspecified: Secondary | ICD-10-CM | POA: Diagnosis not present

## 2020-06-25 ENCOUNTER — Ambulatory Visit (INDEPENDENT_AMBULATORY_CARE_PROVIDER_SITE_OTHER): Payer: No Typology Code available for payment source

## 2020-06-25 DIAGNOSIS — J309 Allergic rhinitis, unspecified: Secondary | ICD-10-CM | POA: Diagnosis not present

## 2020-06-30 ENCOUNTER — Ambulatory Visit (INDEPENDENT_AMBULATORY_CARE_PROVIDER_SITE_OTHER): Payer: No Typology Code available for payment source

## 2020-06-30 DIAGNOSIS — J309 Allergic rhinitis, unspecified: Secondary | ICD-10-CM | POA: Diagnosis not present

## 2020-07-06 ENCOUNTER — Ambulatory Visit (INDEPENDENT_AMBULATORY_CARE_PROVIDER_SITE_OTHER): Payer: No Typology Code available for payment source

## 2020-07-06 DIAGNOSIS — J309 Allergic rhinitis, unspecified: Secondary | ICD-10-CM

## 2020-07-15 ENCOUNTER — Ambulatory Visit (INDEPENDENT_AMBULATORY_CARE_PROVIDER_SITE_OTHER): Payer: No Typology Code available for payment source

## 2020-07-15 DIAGNOSIS — J309 Allergic rhinitis, unspecified: Secondary | ICD-10-CM | POA: Diagnosis not present

## 2020-07-23 ENCOUNTER — Ambulatory Visit (INDEPENDENT_AMBULATORY_CARE_PROVIDER_SITE_OTHER): Payer: No Typology Code available for payment source

## 2020-07-23 DIAGNOSIS — J309 Allergic rhinitis, unspecified: Secondary | ICD-10-CM

## 2020-07-31 ENCOUNTER — Ambulatory Visit (INDEPENDENT_AMBULATORY_CARE_PROVIDER_SITE_OTHER): Payer: No Typology Code available for payment source | Admitting: *Deleted

## 2020-07-31 DIAGNOSIS — J309 Allergic rhinitis, unspecified: Secondary | ICD-10-CM

## 2020-08-10 ENCOUNTER — Ambulatory Visit (INDEPENDENT_AMBULATORY_CARE_PROVIDER_SITE_OTHER): Payer: No Typology Code available for payment source

## 2020-08-10 DIAGNOSIS — J309 Allergic rhinitis, unspecified: Secondary | ICD-10-CM

## 2020-08-20 ENCOUNTER — Ambulatory Visit (INDEPENDENT_AMBULATORY_CARE_PROVIDER_SITE_OTHER): Payer: No Typology Code available for payment source

## 2020-08-20 DIAGNOSIS — J309 Allergic rhinitis, unspecified: Secondary | ICD-10-CM

## 2020-08-25 ENCOUNTER — Ambulatory Visit (INDEPENDENT_AMBULATORY_CARE_PROVIDER_SITE_OTHER): Payer: No Typology Code available for payment source | Admitting: *Deleted

## 2020-08-25 DIAGNOSIS — J309 Allergic rhinitis, unspecified: Secondary | ICD-10-CM

## 2020-09-02 ENCOUNTER — Ambulatory Visit (INDEPENDENT_AMBULATORY_CARE_PROVIDER_SITE_OTHER): Payer: No Typology Code available for payment source | Admitting: *Deleted

## 2020-09-02 DIAGNOSIS — J309 Allergic rhinitis, unspecified: Secondary | ICD-10-CM

## 2020-09-07 ENCOUNTER — Ambulatory Visit: Payer: No Typology Code available for payment source | Admitting: Allergy

## 2020-09-09 ENCOUNTER — Ambulatory Visit (INDEPENDENT_AMBULATORY_CARE_PROVIDER_SITE_OTHER): Payer: No Typology Code available for payment source | Admitting: *Deleted

## 2020-09-09 DIAGNOSIS — J309 Allergic rhinitis, unspecified: Secondary | ICD-10-CM

## 2020-09-18 ENCOUNTER — Ambulatory Visit (INDEPENDENT_AMBULATORY_CARE_PROVIDER_SITE_OTHER): Payer: No Typology Code available for payment source | Admitting: *Deleted

## 2020-09-18 DIAGNOSIS — J309 Allergic rhinitis, unspecified: Secondary | ICD-10-CM | POA: Diagnosis not present

## 2020-09-23 ENCOUNTER — Ambulatory Visit (INDEPENDENT_AMBULATORY_CARE_PROVIDER_SITE_OTHER): Payer: No Typology Code available for payment source

## 2020-09-23 DIAGNOSIS — J309 Allergic rhinitis, unspecified: Secondary | ICD-10-CM

## 2020-09-24 NOTE — Progress Notes (Signed)
Follow Up Note  RE: Steven Figueroa MRN: [id number] DOB: [birth year] Date of Office Visit: 09/25/2020  Referring provider: Roswell Miners, MD Primary care provider: [facility]  Chief Complaint: Allergic Rhinitis  (Has started shots 6 months ago and no changes still has runny nose and sneezing everyday )  History of Present Illness: I had the pleasure of seeing Steven Figueroa for a follow up visit at the [facility] on 09/25/2020. He is a 60 y.o. male, who is being followed for allergic rhinoconjunctivitis on AIT. His previous allergy office visit was on 03/11/2020 with Dr. Selena Figueroa. Today is a regular follow up visit.  Allergic rhino conjunctivitis Started AIT on 04/01/2020 and symptoms are unchanged. Minimal localized itching at injection site.   Sneezing, rhinorrhea, itchy eyes daily still.   Currently taking allegra daily.  He is also apparently taking Flonase 3 nasal spray per nostril in the morning. No nosebleeds.  Patient has glaucoma and takes some type of eye drops for this.   Assessment and Plan: Steven Figueroa is a 60 y.o. male with: Seasonal and perennial allergic rhinoconjunctivitis Past history - Perennial rhinoconjunctivitis symptoms for many years but worse the last year and during the spring.  Currently on Zyrtec 10 mg daily and some type of prescription nasal spray twice a day with some benefit.  No previous ENT evaluation.  He had blood work drawn which showed multiple allergens per patient's report.  Patient follows with ophthalmology for glaucoma injections. 2021 skin testing showed positive to grass, weed, ragweed, trees, mold, dust mites, dog. Interim history - started AIT on 04/01/2020 (G-W-RW-T, M-DM-D) and no issues. Patient has not noticed any improvement in symptoms yet.   Continue environmental control measures as below.  May use over the counter antihistamines such as Allegra 180mg  daily. May take twice a day if needed.  May use azelastine  nasal spray 1-2 sprays per nostril twice a day as needed for runny nose/drainage.  STOP GREEN nasal spray - Flonase.  Given his glaucoma will avoid steroid nasal sprays.  Continue allergy injections.   Return in about 6 months (around 03/28/2021).  Meds ordered this encounter  Medications  . azelastine (ASTELIN) 0.1 % nasal spray    Sig: Place 1-2 sprays into both nostrils 2 (two) times daily as needed (runny nose). Use in each nostril as directed    Dispense:  30 mL    Refill:  5   Lab Orders  No laboratory test(s) ordered today    Diagnostics: None.  Medication List:  Current Outpatient Medications  Medication Sig Dispense Refill  . azelastine (ASTELIN) 0.1 % nasal spray Place 1-2 sprays into both nostrils 2 (two) times daily as needed (runny nose). Use in each nostril as directed 30 mL 5  . cetirizine (ZYRTEC) 10 MG tablet Take 10 mg by mouth daily.    05/28/2021 EPINEPHrine 0.3 mg/0.3 mL IJ SOAJ injection Inject 0.3 mLs (0.3 mg total) into the muscle as needed for anaphylaxis. 1 each 2  . glucose blood test strip USE ONE STRIP FOR TESTING BLOOD SUGAR CHECKS AS DIRECTED BY YOUR MEDICAL PROVIDER    . Omega-3 Fatty Acids (FISH OIL) 1000 MG CAPS Take 1 capsule by mouth 2 (two) times daily.    . Pediatric Multivitamins-Fl (MULTIVITAMINS/FL PO) Take 1 tablet by mouth daily.     No current facility-administered medications for this visit.   Allergies: Allergies  Allergen Reactions  . Codeine Itching  . Iodine  I reviewed his past medical history, social history, family history, and environmental history and no significant changes have been reported from his previous visit.  Review of Systems  Constitutional: Negative for appetite change, chills, fever and unexpected weight change.  HENT: Positive for congestion, postnasal drip and rhinorrhea.   Eyes: Positive for itching.  Respiratory: Negative for cough, chest tightness, shortness of breath and wheezing.   Cardiovascular:  Negative for chest pain.  Gastrointestinal: Negative for abdominal pain.  Genitourinary: Negative for difficulty urinating.  Skin: Negative for rash.  Allergic/Immunologic: Positive for environmental allergies.  Neurological: Negative for headaches.   Objective: BP 126/80   Pulse 98   Temp 97.9 F (36.6 C)   Resp 18   Ht 6\' 1"  (1.854 m)   Wt 188 lb (85.3 kg)   SpO2 96%   BMI 24.80 kg/m  Body mass index is 24.8 kg/m. Physical Exam Vitals and nursing note reviewed.  Constitutional:      Appearance: Normal appearance. He is well-developed.  HENT:     Head: Normocephalic and atraumatic.     Right Ear: Tympanic membrane and external ear normal.     Left Ear: Tympanic membrane and external ear normal.     Nose: Nose normal.     Mouth/Throat:     Mouth: Mucous membranes are moist.     Pharynx: Oropharynx is clear.  Eyes:     Conjunctiva/sclera: Conjunctivae normal.  Cardiovascular:     Rate and Rhythm: Normal rate and regular rhythm.     Heart sounds: Normal heart sounds. No murmur heard. No friction rub. No gallop.   Pulmonary:     Effort: Pulmonary effort is normal.     Breath sounds: Normal breath sounds. No wheezing, rhonchi or rales.  Musculoskeletal:     Cervical back: Neck supple.  Skin:    General: Skin is warm.     Findings: No rash.  Neurological:     Mental Status: He is alert and oriented to person, place, and time.  Psychiatric:        Behavior: Behavior normal.    Previous notes and tests were reviewed. The plan was reviewed with the patient/family, and all questions/concerned were addressed.  It was my pleasure to see Steven Figueroa today and participate in his care. Please feel free to contact me with any questions or concerns.  Sincerely,  Tinnie Gens, DO Allergy & Immunology  [facility] office: [phone] [facility] office: [phone]

## 2020-09-25 ENCOUNTER — Encounter: Payer: Self-pay | Admitting: Allergy

## 2020-09-25 ENCOUNTER — Other Ambulatory Visit: Payer: Self-pay

## 2020-09-25 ENCOUNTER — Ambulatory Visit (INDEPENDENT_AMBULATORY_CARE_PROVIDER_SITE_OTHER): Payer: No Typology Code available for payment source | Admitting: Allergy

## 2020-09-25 VITALS — BP 126/80 | HR 98 | Temp 97.9°F | Resp 18 | Ht 73.0 in | Wt 188.0 lb

## 2020-09-25 DIAGNOSIS — J3089 Other allergic rhinitis: Secondary | ICD-10-CM

## 2020-09-25 DIAGNOSIS — J302 Other seasonal allergic rhinitis: Secondary | ICD-10-CM

## 2020-09-25 DIAGNOSIS — H1013 Acute atopic conjunctivitis, bilateral: Secondary | ICD-10-CM | POA: Diagnosis not present

## 2020-09-25 DIAGNOSIS — H1010 Acute atopic conjunctivitis, unspecified eye: Secondary | ICD-10-CM | POA: Insufficient documentation

## 2020-09-25 MED ORDER — AZELASTINE HCL 0.1 % NA SOLN: 1.0000 | Freq: Two times a day (BID) | NASAL | 5 refills | Status: DC | PRN

## 2020-09-25 NOTE — Assessment & Plan Note (Signed)
Past history - Perennial rhinoconjunctivitis symptoms for many years but worse the last year and during the spring.  Currently on Zyrtec 10 mg daily and some type of prescription nasal spray twice a day with some benefit.  No previous ENT evaluation.  He had blood work drawn which showed multiple allergens per patient's report.  Patient follows with ophthalmology for glaucoma injections. 2021 skin testing showed positive to grass, weed, ragweed, trees, mold, dust mites, dog. Interim history - started AIT on 04/01/2020 (G-W-RW-T, M-DM-D) and no issues. Patient has not noticed any improvement in symptoms yet.   Continue environmental control measures as below.  May use over the counter antihistamines such as Allegra 180mg  daily. May take twice a day if needed.  May use azelastine nasal spray 1-2 sprays per nostril twice a day as needed for runny nose/drainage.  STOP GREEN nasal spray - Flonase.  Given his glaucoma will avoid steroid nasal sprays.  Continue allergy injections.

## 2020-09-25 NOTE — Patient Instructions (Addendum)
Environmental allergies  Past skin testing showed: Positive to grass, weed, ragweed, trees, mold, dust mites, dog.  Continue environmental control measures as below.  May use over the counter antihistamines such as Allegra 180mg  daily. May take twice a day if needed.  May use azelastine nasal spray 1-2 sprays per nostril twice a day as needed for runny nose/drainage.  STOP GREEN nasal spray - Flonase.  Continue allergy injections.   Follow up in 6 months or sooner if needed.   Reducing Pollen Exposure . Pollen seasons: trees (spring), grass (summer) and ragweed/weeds (fall). 11-24-1982 Keep windows closed in your home and car to lower pollen exposure.  Marland Kitchen air conditioning in the bedroom and throughout the house if possible.  . Avoid going out in dry windy days - especially early morning. . Pollen counts are highest between 5 - 10 AM and on dry, hot and windy days.  . Save outside activities for late afternoon or after a heavy rain, when pollen levels are lower.  . Avoid mowing of grass if you have grass pollen allergy. Lilian Kapur Be aware that pollen can also be transported indoors on people and pets.  . Dry your clothes in an automatic dryer rather than hanging them outside where they might collect pollen.  . Rinse hair and eyes before bedtime. Mold Control . Mold and fungi can grow on a variety of surfaces provided certain temperature and moisture conditions exist.  . Outdoor molds grow on plants, decaying vegetation and soil. The major outdoor mold, Alternaria and Cladosporium, are found in very high numbers during hot and dry conditions. Generally, a late summer - fall peak is seen for common outdoor fungal spores. Rain will temporarily lower outdoor mold spore count, but counts rise rapidly when the rainy period ends. . The most important indoor molds are Aspergillus and Penicillium. Dark, humid and poorly ventilated basements are ideal sites for mold growth. The next most common sites of  mold growth are the bathroom and the kitchen. Outdoor (Seasonal) Mold Control . Use air conditioning and keep windows closed. . Avoid exposure to decaying vegetation. 05-13-1978 Avoid leaf raking. . Avoid grain handling. . Consider wearing a face mask if working in moldy areas.  Indoor (Perennial) Mold Control  . Maintain humidity below 50%. . Get rid of mold growth on hard surfaces with water, detergent and, if necessary, 5% bleach (do not mix with other cleaners). Then dry the area completely. If mold covers an area more than 10 square feet, consider hiring an indoor environmental professional. . For clothing, washing with soap and water is best. If moldy items cannot be cleaned and dried, throw them away. . Remove sources e.g. contaminated carpets. . Repair and seal leaking roofs or pipes. Using dehumidifiers in damp basements may be helpful, but empty the water and clean units regularly to prevent mildew from forming. All rooms, especially basements, bathrooms and kitchens, require ventilation and cleaning to deter mold and mildew growth. Avoid carpeting on concrete or damp floors, and storing items in damp areas. Control of House Dust Mite Allergen . Dust mite allergens are a common trigger of allergy and asthma symptoms. While they can be found throughout the house, these microscopic creatures thrive in warm, humid environments such as bedding, upholstered furniture and carpeting. . Because so much time is spent in the bedroom, it is essential to reduce mite levels there.  . Encase pillows, mattresses, and box springs in special allergen-proof fabric covers or airtight, zippered plastic covers.  Marland Kitchen  Bedding should be washed weekly in hot water (130 F) and dried in a hot dryer. Allergen-proof covers are available for comforters and pillows that can't be regularly washed.  Reyes Ivan the allergy-proof covers every few months. Minimize clutter in the bedroom. Keep pets out of the bedroom.  Marland Kitchen Keep humidity  less than 50% by using a dehumidifier or air conditioning. You can buy a humidity measuring device called a hygrometer to monitor this.  . If possible, replace carpets with hardwood, linoleum, or washable area rugs. If that's not possible, vacuum frequently with a vacuum that has a HEPA filter. . Remove all upholstered furniture and non-washable window drapes from the bedroom. . Remove all non-washable stuffed toys from the bedroom.  Wash stuffed toys weekly. Pet Allergen Avoidance: . Contrary to popular opinion, there are no "hypoallergenic" breeds of dogs or cats. That is because people are not allergic to an animal's hair, but to an allergen found in the animal's saliva, dander (dead skin flakes) or urine. Pet allergy symptoms typically occur within minutes. For some people, symptoms can build up and become most severe 8 to 12 hours after contact with the animal. People with severe allergies can experience reactions in public places if dander has been transported on the pet owners' clothing. Marland Kitchen Keeping an animal outdoors is only a partial solution, since homes with pets in the yard still have higher concentrations of animal allergens. . Before getting a pet, ask your allergist to determine if you are allergic to animals. If your pet is already considered part of your family, try to minimize contact and keep the pet out of the bedroom and other rooms where you spend a great deal of time. . As with dust mites, vacuum carpets often or replace carpet with a hardwood floor, tile or linoleum. . High-efficiency particulate air (HEPA) cleaners can reduce allergen levels over time. . While dander and saliva are the source of cat and dog allergens, urine is the source of allergens from rabbits, hamsters, mice and [place] pigs; so ask a non-allergic family member to clean the animal's cage. . If you have a pet allergy, talk to your allergist about the potential for allergy immunotherapy (allergy shots). This  strategy can often provide long-term relief.

## 2020-09-30 ENCOUNTER — Ambulatory Visit (INDEPENDENT_AMBULATORY_CARE_PROVIDER_SITE_OTHER): Payer: No Typology Code available for payment source

## 2020-09-30 DIAGNOSIS — J309 Allergic rhinitis, unspecified: Secondary | ICD-10-CM | POA: Diagnosis not present

## 2020-10-08 ENCOUNTER — Ambulatory Visit (INDEPENDENT_AMBULATORY_CARE_PROVIDER_SITE_OTHER): Payer: No Typology Code available for payment source | Admitting: *Deleted

## 2020-10-08 DIAGNOSIS — J309 Allergic rhinitis, unspecified: Secondary | ICD-10-CM

## 2020-10-15 ENCOUNTER — Ambulatory Visit (INDEPENDENT_AMBULATORY_CARE_PROVIDER_SITE_OTHER): Payer: No Typology Code available for payment source | Admitting: *Deleted

## 2020-10-15 DIAGNOSIS — J309 Allergic rhinitis, unspecified: Secondary | ICD-10-CM

## 2020-10-22 ENCOUNTER — Ambulatory Visit (INDEPENDENT_AMBULATORY_CARE_PROVIDER_SITE_OTHER): Payer: No Typology Code available for payment source | Admitting: *Deleted

## 2020-10-22 DIAGNOSIS — J309 Allergic rhinitis, unspecified: Secondary | ICD-10-CM

## 2020-11-03 ENCOUNTER — Ambulatory Visit (INDEPENDENT_AMBULATORY_CARE_PROVIDER_SITE_OTHER): Payer: No Typology Code available for payment source | Admitting: *Deleted

## 2020-11-03 DIAGNOSIS — J309 Allergic rhinitis, unspecified: Secondary | ICD-10-CM | POA: Diagnosis not present

## 2020-11-13 ENCOUNTER — Ambulatory Visit (INDEPENDENT_AMBULATORY_CARE_PROVIDER_SITE_OTHER): Payer: No Typology Code available for payment source

## 2020-11-13 DIAGNOSIS — J309 Allergic rhinitis, unspecified: Secondary | ICD-10-CM

## 2020-11-19 ENCOUNTER — Ambulatory Visit (INDEPENDENT_AMBULATORY_CARE_PROVIDER_SITE_OTHER): Payer: No Typology Code available for payment source | Admitting: *Deleted

## 2020-11-19 DIAGNOSIS — J309 Allergic rhinitis, unspecified: Secondary | ICD-10-CM | POA: Diagnosis not present

## 2020-12-01 ENCOUNTER — Ambulatory Visit (INDEPENDENT_AMBULATORY_CARE_PROVIDER_SITE_OTHER): Payer: No Typology Code available for payment source | Admitting: *Deleted

## 2020-12-01 DIAGNOSIS — J309 Allergic rhinitis, unspecified: Secondary | ICD-10-CM

## 2020-12-10 ENCOUNTER — Ambulatory Visit (INDEPENDENT_AMBULATORY_CARE_PROVIDER_SITE_OTHER): Payer: No Typology Code available for payment source | Admitting: *Deleted

## 2020-12-10 DIAGNOSIS — J309 Allergic rhinitis, unspecified: Secondary | ICD-10-CM | POA: Diagnosis not present

## 2020-12-17 ENCOUNTER — Ambulatory Visit (INDEPENDENT_AMBULATORY_CARE_PROVIDER_SITE_OTHER): Payer: No Typology Code available for payment source | Admitting: *Deleted

## 2020-12-17 DIAGNOSIS — J309 Allergic rhinitis, unspecified: Secondary | ICD-10-CM | POA: Diagnosis not present

## 2020-12-22 ENCOUNTER — Ambulatory Visit (INDEPENDENT_AMBULATORY_CARE_PROVIDER_SITE_OTHER): Payer: No Typology Code available for payment source | Admitting: *Deleted

## 2020-12-22 DIAGNOSIS — J309 Allergic rhinitis, unspecified: Secondary | ICD-10-CM

## 2020-12-28 ENCOUNTER — Ambulatory Visit: Payer: Self-pay

## 2020-12-28 NOTE — Progress Notes (Signed)
error 

## 2020-12-31 ENCOUNTER — Ambulatory Visit (INDEPENDENT_AMBULATORY_CARE_PROVIDER_SITE_OTHER): Payer: No Typology Code available for payment source | Admitting: *Deleted

## 2020-12-31 DIAGNOSIS — J309 Allergic rhinitis, unspecified: Secondary | ICD-10-CM

## 2021-01-07 ENCOUNTER — Ambulatory Visit (INDEPENDENT_AMBULATORY_CARE_PROVIDER_SITE_OTHER): Payer: No Typology Code available for payment source | Admitting: *Deleted

## 2021-01-07 DIAGNOSIS — J309 Allergic rhinitis, unspecified: Secondary | ICD-10-CM

## 2021-01-20 ENCOUNTER — Ambulatory Visit (INDEPENDENT_AMBULATORY_CARE_PROVIDER_SITE_OTHER): Payer: No Typology Code available for payment source

## 2021-01-20 DIAGNOSIS — J309 Allergic rhinitis, unspecified: Secondary | ICD-10-CM | POA: Diagnosis not present

## 2021-02-03 ENCOUNTER — Ambulatory Visit (INDEPENDENT_AMBULATORY_CARE_PROVIDER_SITE_OTHER): Payer: No Typology Code available for payment source

## 2021-02-03 DIAGNOSIS — J309 Allergic rhinitis, unspecified: Secondary | ICD-10-CM | POA: Diagnosis not present

## 2021-02-11 ENCOUNTER — Ambulatory Visit (INDEPENDENT_AMBULATORY_CARE_PROVIDER_SITE_OTHER): Payer: No Typology Code available for payment source | Admitting: *Deleted

## 2021-02-11 DIAGNOSIS — J309 Allergic rhinitis, unspecified: Secondary | ICD-10-CM

## 2021-02-12 DIAGNOSIS — J301 Allergic rhinitis due to pollen: Secondary | ICD-10-CM

## 2021-02-15 DIAGNOSIS — J3089 Other allergic rhinitis: Secondary | ICD-10-CM | POA: Diagnosis not present

## 2021-02-15 NOTE — Progress Notes (Signed)
VIALS MADE. EXP 02-15-22 

## 2021-02-16 ENCOUNTER — Ambulatory Visit (INDEPENDENT_AMBULATORY_CARE_PROVIDER_SITE_OTHER): Payer: No Typology Code available for payment source

## 2021-02-16 DIAGNOSIS — J309 Allergic rhinitis, unspecified: Secondary | ICD-10-CM | POA: Diagnosis not present

## 2021-02-25 ENCOUNTER — Telehealth: Payer: Self-pay | Admitting: Allergy

## 2021-02-25 NOTE — Telephone Encounter (Signed)
New request for service faxed to [facility] [phone] and emailed to [email or web address].

## 2021-02-26 ENCOUNTER — Ambulatory Visit (INDEPENDENT_AMBULATORY_CARE_PROVIDER_SITE_OTHER): Payer: No Typology Code available for payment source

## 2021-02-26 DIAGNOSIS — J309 Allergic rhinitis, unspecified: Secondary | ICD-10-CM

## 2021-03-05 ENCOUNTER — Ambulatory Visit (INDEPENDENT_AMBULATORY_CARE_PROVIDER_SITE_OTHER): Payer: No Typology Code available for payment source

## 2021-03-05 DIAGNOSIS — J309 Allergic rhinitis, unspecified: Secondary | ICD-10-CM | POA: Diagnosis not present

## 2021-03-24 ENCOUNTER — Ambulatory Visit (INDEPENDENT_AMBULATORY_CARE_PROVIDER_SITE_OTHER): Payer: No Typology Code available for payment source

## 2021-03-24 DIAGNOSIS — J309 Allergic rhinitis, unspecified: Secondary | ICD-10-CM | POA: Diagnosis not present

## 2021-03-30 ENCOUNTER — Other Ambulatory Visit: Payer: Self-pay

## 2021-03-30 ENCOUNTER — Emergency Department
Admission: EM | Admit: 2021-03-30 | Discharge: 2021-03-30 | Disposition: A | Discharge status: Home / Self Care | Payer: No Typology Code available for payment source | Attending: Emergency Medicine | Admitting: Emergency Medicine

## 2021-03-30 ENCOUNTER — Emergency Department: Payer: No Typology Code available for payment source

## 2021-03-30 ENCOUNTER — Encounter: Payer: Self-pay | Admitting: Emergency Medicine

## 2021-03-30 DIAGNOSIS — S6992XA Unspecified injury of left wrist, hand and finger(s), initial encounter: Secondary | ICD-10-CM | POA: Diagnosis present

## 2021-03-30 DIAGNOSIS — W260XXA Contact with knife, initial encounter: Secondary | ICD-10-CM | POA: Diagnosis not present

## 2021-03-30 DIAGNOSIS — Z7984 Long term (current) use of oral hypoglycemic drugs: Secondary | ICD-10-CM | POA: Insufficient documentation

## 2021-03-30 DIAGNOSIS — Z79899 Other long term (current) drug therapy: Secondary | ICD-10-CM | POA: Diagnosis not present

## 2021-03-30 DIAGNOSIS — Z87891 Personal history of nicotine dependence: Secondary | ICD-10-CM | POA: Diagnosis not present

## 2021-03-30 DIAGNOSIS — S61012A Laceration without foreign body of left thumb without damage to nail, initial encounter: Secondary | ICD-10-CM | POA: Diagnosis not present

## 2021-03-30 DIAGNOSIS — Z7982 Long term (current) use of aspirin: Secondary | ICD-10-CM | POA: Diagnosis not present

## 2021-03-30 DIAGNOSIS — Y92000 Kitchen of unspecified non-institutional (private) residence as  the place of occurrence of the external cause: Secondary | ICD-10-CM | POA: Insufficient documentation

## 2021-03-30 MED ORDER — LIDOCAINE HCL (PF) 1 % IJ SOLN
5.0000 mL | Freq: Once | INTRAMUSCULAR | Status: AC
  Administered 2021-03-30: 5 mL
  Filled 2021-03-30: qty 5

## 2021-03-30 MED ORDER — BACITRACIN ZINC 500 UNIT/GM EX OINT
TOPICAL_OINTMENT | Freq: Once | CUTANEOUS | Status: AC
  Administered 2021-03-30: 1 via TOPICAL
  Filled 2021-03-30: qty 0.9

## 2021-03-30 NOTE — Discharge Instructions (Addendum)
Use Tylenol for pain and fevers.  Up to 1000 mg per dose, up to 4 times per day.  Do not take more than 4000 mg of Tylenol/acetaminophen within 24 hours..  In general, keep the area clean and dry. Then apply a Bacitracin/Neosporin type of antibiotic ointment.   Gently wash with warm soap and water once daily, and again if it gets dirty. Don't vigorously scrub at the wound.  Gently pat dry.  Once dry, apply Neosporin / bacitracin type antibiotic ointment. It is important to keep the wound moist with an antibiotic ointment, or a small amount of Vaseline, for the first 5 days. Keeping it moist/clean will help the area heal faster and prevent scar tissue formation.  If you are doing anything active, keep it covered.  If you are sitting around at home, you may leave it uncovered.

## 2021-03-30 NOTE — ED Provider Notes (Signed)
[facility] Emergency Department Provider Note ____________________________________________   Event Date/Time   First MD Initiated Contact with Patient 03/30/21 1812     (approximate)  I have reviewed the triage vital signs and the nursing notes.  HISTORY  Chief Complaint Laceration   HPI Steven Figueroa is a 60 y.o. malewho presents to the ED for evaluation of accidental thumb laceration  Chart review indicates no relevant history.  No thinners.  Patient presents to the ED, accompanied by his fiance, for evaluation of accidental left thumb laceration.  He reports cleaning multiple kitchen knives in his kitchen sink this evening when he accidentally sliced his left thumb.  Reports a single laceration, just prior to arrival, to his left thumb, just proximal to his nail.  Denies additional injuries.  He is right-hand dominant.  Reports it took a lot of bandages and tape for hemostasis.  Reports mild pain right now.  History reviewed. No pertinent past medical history.  Patient Active Problem List   Diagnosis Date Noted   Seasonal and perennial allergic rhinoconjunctivitis 09/25/2020    Past Surgical History:  Procedure Laterality Date   BUNIONECTOMY     EYE SURGERY     UMBILICAL HERNIA REPAIR      Prior to Admission medications   Medication Sig Start Date End Date Taking? Authorizing Provider  Alogliptin Benzoate 25 MG TABS Take 1 tablet by mouth daily. 05/02/20   [provider]  amLODipine (NORVASC) 10 MG tablet TAKE ONE TABLET BY MOUTH DAILY FOR BLOOD PRESSURE 11/08/19   [provider]  aspirin 81 MG EC tablet TAKE ONE TABLET BY MOUTH DAILY FOR HEART 04/30/20   [provider]  azelastine (ASTELIN) 0.1 % nasal spray Place 1-2 sprays into both nostrils 2 (two) times daily as needed (runny nose). Use in each nostril as directed 09/25/20   Ellamae Sia, DO  Brinzolamide-Brimonidine 1-0.2 % SUSP INSTILL 1 DROP IN BOTH EYES  THREE TIMES A DAY 12/12/19   [provider]  cetirizine (ZYRTEC) 10 MG tablet Take 10 mg by mouth daily.    [provider]  Cholecalciferol 50 MCG (2000 UT) TABS Take 2 tablets by mouth daily. 11/26/19   [provider]  EPINEPHrine 0.3 mg/0.3 mL IJ SOAJ injection Inject 0.3 mLs (0.3 mg total) into the muscle as needed for anaphylaxis. 03/11/20   Ellamae Sia, DO  ferrous sulfate 325 (65 FE) MG tablet Take 1 tablet by mouth every other day. 05/07/20   [provider]  gabapentin (NEURONTIN) 300 MG capsule TAKE ONE CAPSULE BY MOUTH IN THE MORNING AND TAKE ONE CAPSULE AT NOON AND TAKE TWO CAPSULES AT BEDTIME 01/14/20   [provider]  glipiZIDE (GLUCOTROL) 5 MG tablet TAKE ONE TABLET BY MOUTH TWICE A DAY BEFORE MEALS (TAKE THIS MEDICATION 30 MINUTES PRIOR TO A MEAL) FOR DIABETES. MAY CAUSE LOW BLOOD GLUCOSE. 10/21/19   [provider]  glucose blood test strip USE ONE STRIP FOR TESTING BLOOD SUGAR CHECKS AS DIRECTED BY YOUR MEDICAL PROVIDER 09/17/20   [provider]  latanoprost (XALATAN) 0.005 % ophthalmic solution INSTILL 1 DROP IN BOTH EYES AT BEDTIME 05/11/20   [provider]  losartan (COZAAR) 100 MG tablet Take 1 tablet by mouth daily. 01/12/20   [provider]  Magnesium Oxide 420 MG TABS TAKE ONE TABLET BY MOUTH TWICE A DAY FOR LOW MAGNESIUM 04/30/20   [provider]  metFORMIN (GLUCOPHAGE) 850 MG tablet TAKE ONE TABLET  BY MOUTH THREE TIMES A DAY WITH MEALS FOR DIABETES 02/03/20   [provider]  methazolamide (NEPTAZANE) 50 MG tablet Take 1 tablet by mouth 3 (three) times daily. 06/18/20   [provider]  modafinil (PROVIGIL) 200 MG tablet TAKE ONE TABLET BY MOUTH DAILY (EVERY MORNING) 09/02/20   [provider]  montelukast (SINGULAIR) 10 MG tablet Take 1 tablet by mouth daily. 11/18/19   [provider]  Omega-3 Fatty Acids (FISH OIL) 1000 MG CAPS Take 1 capsule by mouth 2  (two) times daily. 11/26/19   [provider]  Pediatric Multivitamins-Fl (MULTIVITAMINS/FL PO) Take 1 tablet by mouth daily. 02/06/20   [provider]  pioglitazone (ACTOS) 45 MG tablet TAKE ONE TABLET BY MOUTH DAILY FOR DIABETES 11/26/19   [provider]  pravastatin (PRAVACHOL) 20 MG tablet TAKE ONE TABLET BY MOUTH AT BEDTIME FOR CHOLESTEROL 11/26/19   [provider]  timolol (TIMOPTIC) 0.5 % ophthalmic solution INSTILL 1 DROP IN RIGHT EYE EVERY 12 HOURS 05/26/20   [provider]    Allergies Codeine and Iodine  No family history on file.  Social History Social History   Tobacco Use   Smoking status: Former   Smokeless tobacco: Never  Building services engineer Use: Never used  Substance Use Topics   Alcohol use: Not Currently   Drug use: Never    Review of Systems  Constitutional: No fever/chills Eyes: No visual changes. ENT: No sore throat. Cardiovascular: Denies chest pain. Respiratory: Denies shortness of breath. Gastrointestinal: No abdominal pain.  No nausea, no vomiting.  No diarrhea.  No constipation. Genitourinary: Negative for dysuria. Musculoskeletal: Negative for back pain. Positive for left thumb pain and laceration Skin: Negative for rash. Neurological: Negative for headaches, focal weakness or numbness.  ____________________________________________   PHYSICAL EXAM:  VITAL SIGNS: Vitals:   03/30/21 1600  BP: 121/82  Pulse: 98  Resp: 18  Temp: 98.6 F (37 C)  SpO2: 99%     Constitutional: Alert and oriented. Well appearing and in no acute distress. Eyes: Conjunctivae are normal. PERRL. EOMI. Head: Atraumatic. Nose: No congestion/rhinnorhea. Mouth/Throat: Mucous membranes are moist.  Oropharynx non-erythematous. Neck: No stridor. No cervical spine tenderness to palpation. Cardiovascular: Normal rate, regular rhythm. Grossly normal heart sounds.  Good peripheral circulation. Respiratory: Normal  respiratory effort.  No retractions. Lungs CTAB. Gastrointestinal: Soft , nondistended, nontender to palpation. No CVA tenderness. Musculoskeletal: No lower extremity tenderness nor edema.  No joint effusions.  Oblique 1.5 cm laceration to the dorsal aspect of the distal phalanx of the left thumb.  About a centimeter proximal to the base of the nail.  No evidence of nail injury.  No subungual hematoma.  Full active and passive ROM.  Hemostatic on arrival to the ED.  Some slow oozing with ranging. Neurologic:  Normal speech and language. No gross focal neurologic deficits are appreciated. No gait instability noted. Skin:  Skin is warm, dry and intact. No rash noted. Psychiatric: Mood and affect are normal. Speech and behavior are normal.  ____________________________________________   LABS (all labs ordered are listed, but only abnormal results are displayed)  Labs Reviewed - No data to display ____________________________________________  12 Lead EKG   ____________________________________________  RADIOLOGY  ED MD interpretation: Plain film of the left thumb reviewed by me without evidence of fracture or foreign body  Official radiology report(s): DG Finger Thumb Left  Result Date: 03/30/2021 CLINICAL DATA:  First digit laceration EXAM: LEFT THUMB 2+V COMPARISON:  None.  FINDINGS: Frontal, oblique, and lateral views of the left first digit are obtained. No fracture, subluxation, or dislocation. Joint spaces are well preserved. Soft tissues are unremarkable. No radiopaque foreign body. IMPRESSION: 1. No fracture or radiopaque foreign body. Electronically Signed   By: Sharlet Salina M.D.   On: 03/30/2021 18:53    ____________________________________________   PROCEDURES and INTERVENTIONS  Procedure(s) performed (including Critical Care):  Marland KitchenMarland KitchenLaceration Repair  Date/Time: 03/30/2021 7:17 PM Performed by: Delton Prairie, MD Authorized by: Delton Prairie, MD   Consent:    Consent  obtained:  Verbal   Consent given by:  Patient   Risks, benefits, and alternatives were discussed: yes     Risks discussed:  Infection, pain, retained foreign body and poor cosmetic result Anesthesia:    Anesthesia method:  Nerve block   Block location:  Left thumb digital block   Block needle gauge:  25 G   Block anesthetic:  Lidocaine 1% w/o epi   Block injection procedure:  Anatomic landmarks identified, introduced needle, incremental injection, anatomic landmarks palpated and negative aspiration for blood   Block outcome:  Anesthesia achieved Laceration details:    Location:  Finger   Finger location:  L thumb   Length (cm):  1.5 Exploration:    Hemostasis achieved with:  Direct pressure   Imaging obtained: x-ray     Imaging outcome: foreign body not noted   Treatment:    Area cleansed with:  Povidone-iodine   Amount of cleaning:  Standard   Irrigation solution:  Sterile saline   Irrigation method:  Pressure wash   Visualized foreign bodies/material removed: no   Skin repair:    Repair method:  Sutures   Suture size:  4-0   Wound skin closure material used: Monocryl.   Suture technique:  Simple interrupted   Number of sutures:  1 Approximation:    Approximation:  Close Repair type:    Repair type:  Simple Post-procedure details:    Dressing:  Antibiotic ointment   Procedure completion:  Tolerated well, no immediate complications  Medications  lidocaine (PF) (XYLOCAINE) 1 % injection 5 mL (5 mLs Infiltration Given 03/30/21 1825)  bacitracin ointment (1 application Topical Given 03/30/21 1911)    ____________________________________________   MDM / ED COURSE   Pleasant 60 year old male presents to the ED with accidental left thumb laceration from a kitchen knife requiring 1 suture and outpatient management.  Single laceration to the dorsal aspect of the distal phalanx of the left thumb, clean wound margins that are nearly well opposed.  Laceration is not very deep and  just into the dermis.  The radial aspect is a little bit deeper and did tolerate a single suture due to some oozing and gaping with flexion of the distal phalanx.  Plain film demonstrates no fracture or foreign body.  Repair well-tolerated.  We will discharge with return precautions and recommendations for wound care at home.  Clinical Course as of 03/30/21 1917  Tue Mar 30, 2021  6967 Digital block placed [DS]  1902 Lac repaired, well tolerated [DS]    Clinical Course User Index [DS] Delton Prairie, MD    ____________________________________________   FINAL CLINICAL IMPRESSION(S) / ED DIAGNOSES  Final diagnoses:  Laceration of left thumb without foreign body without damage to nail, initial encounter     ED Discharge Orders     None        Steven Figueroa   Note:  This document was prepared using Dragon voice recognition software and may  include unintentional dictation errors.    Delton Prairie, MD 03/30/21 1919

## 2021-03-30 NOTE — ED Notes (Signed)
See triage note  presents with laceration to left thumb  states he cut his thumb when washing dishes

## 2021-03-30 NOTE — ED Triage Notes (Signed)
Pt reports that he was washing dishes, and sliced his left thumb on a knife. He states that his tetanus is up to date. Thumb is wrapped up with tape and gauze

## 2021-04-02 ENCOUNTER — Ambulatory Visit (INDEPENDENT_AMBULATORY_CARE_PROVIDER_SITE_OTHER): Payer: No Typology Code available for payment source

## 2021-04-02 DIAGNOSIS — J309 Allergic rhinitis, unspecified: Secondary | ICD-10-CM

## 2021-04-13 ENCOUNTER — Ambulatory Visit (INDEPENDENT_AMBULATORY_CARE_PROVIDER_SITE_OTHER): Payer: No Typology Code available for payment source | Admitting: *Deleted

## 2021-04-13 DIAGNOSIS — J309 Allergic rhinitis, unspecified: Secondary | ICD-10-CM

## 2021-04-23 ENCOUNTER — Ambulatory Visit (INDEPENDENT_AMBULATORY_CARE_PROVIDER_SITE_OTHER): Payer: No Typology Code available for payment source

## 2021-04-23 DIAGNOSIS — J309 Allergic rhinitis, unspecified: Secondary | ICD-10-CM | POA: Diagnosis not present

## 2021-04-27 ENCOUNTER — Ambulatory Visit (INDEPENDENT_AMBULATORY_CARE_PROVIDER_SITE_OTHER): Payer: No Typology Code available for payment source

## 2021-04-27 DIAGNOSIS — J309 Allergic rhinitis, unspecified: Secondary | ICD-10-CM | POA: Diagnosis not present

## 2021-04-27 MED ORDER — AZELASTINE HCL 0.1 % NA SOLN: 1.0000 | Freq: Two times a day (BID) | NASAL | 5 refills | Status: DC | PRN

## 2021-05-05 ENCOUNTER — Ambulatory Visit (INDEPENDENT_AMBULATORY_CARE_PROVIDER_SITE_OTHER): Payer: No Typology Code available for payment source | Admitting: *Deleted

## 2021-05-05 DIAGNOSIS — J309 Allergic rhinitis, unspecified: Secondary | ICD-10-CM

## 2021-05-10 ENCOUNTER — Encounter: Payer: Self-pay | Admitting: Allergy

## 2021-05-10 ENCOUNTER — Other Ambulatory Visit: Payer: Self-pay

## 2021-05-10 ENCOUNTER — Ambulatory Visit: Payer: Self-pay

## 2021-05-10 ENCOUNTER — Ambulatory Visit (INDEPENDENT_AMBULATORY_CARE_PROVIDER_SITE_OTHER): Payer: No Typology Code available for payment source | Admitting: Allergy

## 2021-05-10 VITALS — BP 130/78 | HR 94 | Temp 97.8°F | Resp 18 | Ht 73.0 in | Wt 187.4 lb

## 2021-05-10 DIAGNOSIS — J302 Other seasonal allergic rhinitis: Secondary | ICD-10-CM

## 2021-05-10 DIAGNOSIS — J309 Allergic rhinitis, unspecified: Secondary | ICD-10-CM

## 2021-05-10 DIAGNOSIS — H1013 Acute atopic conjunctivitis, bilateral: Secondary | ICD-10-CM

## 2021-05-10 DIAGNOSIS — H1010 Acute atopic conjunctivitis, unspecified eye: Secondary | ICD-10-CM

## 2021-05-10 MED ORDER — AZELASTINE HCL 0.1 % NA SOLN: 1.0000 | Freq: Two times a day (BID) | NASAL | 11 refills | Status: DC | PRN

## 2021-05-10 NOTE — Assessment & Plan Note (Signed)
Past history - Perennial rhinoconjunctivitis symptoms for many years but worse the last year and during the spring.  No previous ENT evaluation.  Patient follows with ophthalmology for glaucoma injections. 2021 skin testing showed positive to grass, weed, ragweed, trees, mold, dust mites, dog. Started AIT on 04/01/2020 (G-W-RW-T, M-DM-D). Interim history -  Some improvement in symptoms.  Continue environmental control measures as below.  May use over the counter antihistamines such as Allegra 180mg  daily. May take twice a day if needed.  May use azelastine nasal spray 1-2 sprays per nostril twice a day as needed for runny nose/drainage.  Continue allergy injections - given today.  No steroid nasal sprays due to glaucoma issues.

## 2021-05-10 NOTE — Patient Instructions (Addendum)
Environmental allergies Past skin testing showed: Positive to grass, weed, ragweed, trees, mold, dust mites, dog. Continue environmental control measures as below. May use over the counter antihistamines such as Allegra 180mg  daily. May take twice a day if needed. May use azelastine nasal spray 1-2 sprays per nostril twice a day as needed for runny nose/drainage. Continue allergy injections - given today.   Follow up in 6 months or sooner if needed.   Reducing Pollen Exposure Pollen seasons: trees (spring), grass (summer) and ragweed/weeds (fall). Keep windows closed in your home and car to lower pollen exposure.  Install air conditioning in the bedroom and throughout the house if possible.  Avoid going out in dry windy days - especially early morning. Pollen counts are highest between 5 - 10 AM and on dry, hot and windy days.  Save outside activities for late afternoon or after a heavy rain, when pollen levels are lower.  Avoid mowing of grass if you have grass pollen allergy. Be aware that pollen can also be transported indoors on people and pets.  Dry your clothes in an automatic dryer rather than hanging them outside where they might collect pollen.  Rinse hair and eyes before bedtime. Mold Control Mold and fungi can grow on a variety of surfaces provided certain temperature and moisture conditions exist.  Outdoor molds grow on plants, decaying vegetation and soil. The major outdoor mold, Alternaria and Cladosporium, are found in very high numbers during hot and dry conditions. Generally, a late summer - fall peak is seen for common outdoor fungal spores. Rain will temporarily lower outdoor mold spore count, but counts rise rapidly when the rainy period ends. The most important indoor molds are Aspergillus and Penicillium. Dark, humid and poorly ventilated basements are ideal sites for mold growth. The next most common sites of mold growth are the bathroom and the kitchen. Outdoor  (Seasonal) Mold Control Use air conditioning and keep windows closed. Avoid exposure to decaying vegetation. Avoid leaf raking. Avoid grain handling. Consider wearing a face mask if working in moldy areas.  Indoor (Perennial) Mold Control  Maintain humidity below 50%. Get rid of mold growth on hard surfaces with water, detergent and, if necessary, 5% bleach (do not mix with other cleaners). Then dry the area completely. If mold covers an area more than 10 square feet, consider hiring an indoor environmental professional. For clothing, washing with soap and water is best. If moldy items cannot be cleaned and dried, throw them away. Remove sources e.g. contaminated carpets. Repair and seal leaking roofs or pipes. Using dehumidifiers in damp basements may be helpful, but empty the water and clean units regularly to prevent mildew from forming. All rooms, especially basements, bathrooms and kitchens, require ventilation and cleaning to deter mold and mildew growth. Avoid carpeting on concrete or damp floors, and storing items in damp areas. Control of House Dust Mite Allergen Dust mite allergens are a common trigger of allergy and asthma symptoms. While they can be found throughout the house, these microscopic creatures thrive in warm, humid environments such as bedding, upholstered furniture and carpeting. Because so much time is spent in the bedroom, it is essential to reduce mite levels there.  Encase pillows, mattresses, and box springs in special allergen-proof fabric covers or airtight, zippered plastic covers.  Bedding should be washed weekly in hot water (130 F) and dried in a hot dryer. Allergen-proof covers are available for comforters and pillows that can't be regularly washed.  Wash the allergy-proof covers every  few months. Minimize clutter in the bedroom. Keep pets out of the bedroom.  Keep humidity less than 50% by using a dehumidifier or air conditioning. You can buy a humidity  measuring device called a hygrometer to monitor this.  If possible, replace carpets with hardwood, linoleum, or washable area rugs. If that's not possible, vacuum frequently with a vacuum that has a HEPA filter. Remove all upholstered furniture and non-washable window drapes from the bedroom. Remove all non-washable stuffed toys from the bedroom.  Wash stuffed toys weekly. Pet Allergen Avoidance: Contrary to popular opinion, there are no "hypoallergenic" breeds of dogs or cats. That is because people are not allergic to an animal's hair, but to an allergen found in the animal's saliva, dander (dead skin flakes) or urine. Pet allergy symptoms typically occur within minutes. For some people, symptoms can build up and become most severe 8 to 12 hours after contact with the animal. People with severe allergies can experience reactions in public places if dander has been transported on the pet owners' clothing. Keeping an animal outdoors is only a partial solution, since homes with pets in the yard still have higher concentrations of animal allergens. Before getting a pet, ask your allergist to determine if you are allergic to animals. If your pet is already considered part of your family, try to minimize contact and keep the pet out of the bedroom and other rooms where you spend a great deal of time. As with dust mites, vacuum carpets often or replace carpet with a hardwood floor, tile or linoleum. High-efficiency particulate air (HEPA) cleaners can reduce allergen levels over time. While dander and saliva are the source of cat and dog allergens, urine is the source of allergens from rabbits, hamsters, mice and [place] pigs; so ask a non-allergic family member to clean the animal's cage. If you have a pet allergy, talk to your allergist about the potential for allergy immunotherapy (allergy shots). This strategy can often provide long-term relief.

## 2021-05-10 NOTE — Progress Notes (Signed)
Follow Up Note  RE: Steven Figueroa MRN: [id number] DOB: September 23, 1960 Date of Office Visit: 05/10/2021  Referring provider: [facility] Primary care provider: [facility]  Chief Complaint: Follow-up  History of Present Illness: I had the pleasure of seeing Steven Figueroa for a follow up visit at the [facility] on 05/10/2021. He is a 60 y.o. male, who is being followed for allergic rhinoconjunctivitis on AIT. His previous allergy office visit was on 09/25/2020 with Dr. Selena Batten. Today is a regular follow up visit.  Seasonal and perennial allergic rhinoconjunctivitis Currently on allergy injections and doing well on it. He noted improvement in symptoms - less sneezing and less rhinorrhea. No nosebleeds. Taking azelastine 2 sprays per nostril in the morning and allegra 180mg  daily with good benefit  Being treated for glaucoma.   Assessment and Plan: Steven Figueroa is a 60 y.o. male with: Seasonal and perennial allergic rhinoconjunctivitis Past history - Perennial rhinoconjunctivitis symptoms for many years but worse the last year and during the spring.  No previous ENT evaluation.  Patient follows with ophthalmology for glaucoma injections. 2021 skin testing showed positive to grass, weed, ragweed, trees, mold, dust mites, dog. Started AIT on 04/01/2020 (G-W-RW-T, M-DM-D). Interim history -  Some improvement in symptoms. Continue environmental control measures as below. May use over the counter antihistamines such as Allegra 180mg  daily. May take twice a day if needed. May use azelastine nasal spray 1-2 sprays per nostril twice a day as needed for runny nose/drainage. Continue allergy injections - given today. No steroid nasal sprays due to glaucoma issues.  Return in about 6 months (around 11/08/2021).  Meds ordered this encounter  Medications   azelastine (ASTELIN) 0.1 % nasal spray    Sig: Place 1-2 sprays into both nostrils 2 (two) times daily as needed (runny  nose). Use in each nostril as directed    Dispense:  30 mL    Refill:  11    Lab Orders  No laboratory test(s) ordered today    Diagnostics: None.   Medication List:  Current Outpatient Medications  Medication Sig Dispense Refill   Alogliptin Benzoate 25 MG TABS Take 1 tablet by mouth daily.     amLODipine (NORVASC) 10 MG tablet TAKE ONE TABLET BY MOUTH DAILY FOR BLOOD PRESSURE     aspirin 81 MG EC tablet TAKE ONE TABLET BY MOUTH DAILY FOR HEART     Brinzolamide-Brimonidine 1-0.2 % SUSP INSTILL 1 DROP IN BOTH EYES THREE TIMES A DAY     cetirizine (ZYRTEC) 10 MG tablet Take 10 mg by mouth daily.     Cholecalciferol 50 MCG (2000 UT) TABS Take 2 tablets by mouth daily.     EPINEPHrine 0.3 mg/0.3 mL IJ SOAJ injection Inject 0.3 mLs (0.3 mg total) into the muscle as needed for anaphylaxis. 1 each 2   ferrous sulfate 325 (65 FE) MG tablet Take 1 tablet by mouth every other day.     gabapentin (NEURONTIN) 300 MG capsule TAKE ONE CAPSULE BY MOUTH IN THE MORNING AND TAKE ONE CAPSULE AT NOON AND TAKE TWO CAPSULES AT BEDTIME     glipiZIDE (GLUCOTROL) 5 MG tablet TAKE ONE TABLET BY MOUTH TWICE A DAY BEFORE MEALS (TAKE THIS MEDICATION 30 MINUTES PRIOR TO A MEAL) FOR DIABETES. MAY CAUSE LOW BLOOD GLUCOSE.     glucose blood test strip USE ONE STRIP FOR TESTING BLOOD SUGAR CHECKS AS DIRECTED BY YOUR MEDICAL PROVIDER     latanoprost (XALATAN) 0.005 %  ophthalmic solution INSTILL 1 DROP IN BOTH EYES AT BEDTIME     losartan (COZAAR) 100 MG tablet Take 1 tablet by mouth daily.     Magnesium Oxide 420 MG TABS TAKE ONE TABLET BY MOUTH TWICE A DAY FOR LOW MAGNESIUM     metFORMIN (GLUCOPHAGE) 850 MG tablet TAKE ONE TABLET BY MOUTH THREE TIMES A DAY WITH MEALS FOR DIABETES     methazolamide (NEPTAZANE) 50 MG tablet Take 1 tablet by mouth 3 (three) times daily.     modafinil (PROVIGIL) 200 MG tablet TAKE ONE TABLET BY MOUTH DAILY (EVERY MORNING)     montelukast (SINGULAIR) 10 MG tablet Take 1 tablet by mouth  daily.     Omega-3 Fatty Acids (FISH OIL) 1000 MG CAPS Take 1 capsule by mouth 2 (two) times daily.     Pediatric Multivitamins-Fl (MULTIVITAMINS/FL PO) Take 1 tablet by mouth daily.     pioglitazone (ACTOS) 45 MG tablet TAKE ONE TABLET BY MOUTH DAILY FOR DIABETES     pravastatin (PRAVACHOL) 20 MG tablet TAKE ONE TABLET BY MOUTH AT BEDTIME FOR CHOLESTEROL     timolol (TIMOPTIC) 0.5 % ophthalmic solution INSTILL 1 DROP IN RIGHT EYE EVERY 12 HOURS     azelastine (ASTELIN) 0.1 % nasal spray Place 1-2 sprays into both nostrils 2 (two) times daily as needed (runny nose). Use in each nostril as directed 30 mL 11   No current facility-administered medications for this visit.   Allergies: Allergies  Allergen Reactions   Codeine Itching   Iodine    I reviewed his past medical history, social history, family history, and environmental history and no significant changes have been reported from his previous visit.  Review of Systems  Constitutional:  Negative for appetite change, chills, fever and unexpected weight change.  HENT:  Positive for congestion, postnasal drip and rhinorrhea.   Respiratory:  Negative for cough, chest tightness, shortness of breath and wheezing.   Cardiovascular:  Negative for chest pain.  Gastrointestinal:  Negative for abdominal pain.  Genitourinary:  Negative for difficulty urinating.  Skin:  Negative for rash.  Allergic/Immunologic: Positive for environmental allergies.  Neurological:  Negative for headaches.   Objective: BP 130/78   Pulse 94   Temp 97.8 F (36.6 C) (Temporal)   Resp 18   Ht 6\' 1"  (1.854 m)   Wt 187 lb 6 oz (85 kg)   SpO2 96%   BMI 24.72 kg/m  Body mass index is 24.72 kg/m. Physical Exam Vitals and nursing note reviewed.  Constitutional:      Appearance: Normal appearance. He is well-developed.  HENT:     Head: Normocephalic and atraumatic.     Right Ear: Tympanic membrane and external ear normal.     Left Ear: Tympanic membrane and  external ear normal.     Nose: Nose normal.     Mouth/Throat:     Mouth: Mucous membranes are moist.     Pharynx: Oropharynx is clear.  Eyes:     Conjunctiva/sclera: Conjunctivae normal.  Cardiovascular:     Rate and Rhythm: Normal rate and regular rhythm.     Heart sounds: Normal heart sounds. No murmur heard.   No friction rub. No gallop.  Pulmonary:     Effort: Pulmonary effort is normal.     Breath sounds: Normal breath sounds. No wheezing, rhonchi or rales.  Musculoskeletal:     Cervical back: Neck supple.  Skin:    General: Skin is warm.     Findings: No  rash.  Neurological:     Mental Status: He is alert and oriented to person, place, and time.  Psychiatric:        Behavior: Behavior normal.  Previous notes and tests were reviewed. The plan was reviewed with the patient/family, and all questions/concerned were addressed.  It was my pleasure to see Steven Figueroa today and participate in his care. Please feel free to contact me with any questions or concerns.  Sincerely,  Wyline Mood, DO Allergy & Immunology  [facility] office: [phone] [facility] office: [phone]

## 2021-05-20 ENCOUNTER — Ambulatory Visit (INDEPENDENT_AMBULATORY_CARE_PROVIDER_SITE_OTHER): Payer: No Typology Code available for payment source | Admitting: *Deleted

## 2021-05-20 DIAGNOSIS — J309 Allergic rhinitis, unspecified: Secondary | ICD-10-CM | POA: Diagnosis not present

## 2021-05-26 ENCOUNTER — Ambulatory Visit (INDEPENDENT_AMBULATORY_CARE_PROVIDER_SITE_OTHER): Payer: No Typology Code available for payment source

## 2021-05-26 DIAGNOSIS — J309 Allergic rhinitis, unspecified: Secondary | ICD-10-CM | POA: Diagnosis not present

## 2021-06-03 ENCOUNTER — Ambulatory Visit (INDEPENDENT_AMBULATORY_CARE_PROVIDER_SITE_OTHER): Payer: No Typology Code available for payment source | Admitting: *Deleted

## 2021-06-03 DIAGNOSIS — J309 Allergic rhinitis, unspecified: Secondary | ICD-10-CM | POA: Diagnosis not present

## 2021-06-03 NOTE — Progress Notes (Signed)
VIALS MADE. EXP 06-04-22 DD 

## 2021-06-04 DIAGNOSIS — J301 Allergic rhinitis due to pollen: Secondary | ICD-10-CM | POA: Diagnosis not present

## 2021-06-07 DIAGNOSIS — J3089 Other allergic rhinitis: Secondary | ICD-10-CM | POA: Diagnosis not present

## 2021-06-09 ENCOUNTER — Ambulatory Visit (INDEPENDENT_AMBULATORY_CARE_PROVIDER_SITE_OTHER): Payer: No Typology Code available for payment source

## 2021-06-09 DIAGNOSIS — J309 Allergic rhinitis, unspecified: Secondary | ICD-10-CM

## 2021-06-16 ENCOUNTER — Ambulatory Visit (INDEPENDENT_AMBULATORY_CARE_PROVIDER_SITE_OTHER): Payer: No Typology Code available for payment source

## 2021-06-16 DIAGNOSIS — J309 Allergic rhinitis, unspecified: Secondary | ICD-10-CM

## 2021-06-24 ENCOUNTER — Ambulatory Visit (INDEPENDENT_AMBULATORY_CARE_PROVIDER_SITE_OTHER): Payer: No Typology Code available for payment source

## 2021-06-24 DIAGNOSIS — J309 Allergic rhinitis, unspecified: Secondary | ICD-10-CM | POA: Diagnosis not present

## 2021-07-07 ENCOUNTER — Ambulatory Visit (INDEPENDENT_AMBULATORY_CARE_PROVIDER_SITE_OTHER): Payer: No Typology Code available for payment source

## 2021-07-07 DIAGNOSIS — J309 Allergic rhinitis, unspecified: Secondary | ICD-10-CM | POA: Diagnosis not present

## 2021-07-13 ENCOUNTER — Ambulatory Visit (INDEPENDENT_AMBULATORY_CARE_PROVIDER_SITE_OTHER): Payer: No Typology Code available for payment source | Admitting: *Deleted

## 2021-07-13 DIAGNOSIS — J309 Allergic rhinitis, unspecified: Secondary | ICD-10-CM

## 2021-07-21 ENCOUNTER — Ambulatory Visit (INDEPENDENT_AMBULATORY_CARE_PROVIDER_SITE_OTHER): Payer: No Typology Code available for payment source

## 2021-07-21 DIAGNOSIS — J309 Allergic rhinitis, unspecified: Secondary | ICD-10-CM

## 2021-07-28 ENCOUNTER — Ambulatory Visit (INDEPENDENT_AMBULATORY_CARE_PROVIDER_SITE_OTHER): Payer: No Typology Code available for payment source

## 2021-07-28 DIAGNOSIS — J309 Allergic rhinitis, unspecified: Secondary | ICD-10-CM

## 2021-08-05 ENCOUNTER — Ambulatory Visit (INDEPENDENT_AMBULATORY_CARE_PROVIDER_SITE_OTHER): Payer: No Typology Code available for payment source

## 2021-08-05 DIAGNOSIS — J309 Allergic rhinitis, unspecified: Secondary | ICD-10-CM

## 2021-08-24 ENCOUNTER — Ambulatory Visit (INDEPENDENT_AMBULATORY_CARE_PROVIDER_SITE_OTHER): Payer: No Typology Code available for payment source | Admitting: *Deleted

## 2021-08-24 DIAGNOSIS — J309 Allergic rhinitis, unspecified: Secondary | ICD-10-CM | POA: Diagnosis not present

## 2021-09-07 ENCOUNTER — Ambulatory Visit (INDEPENDENT_AMBULATORY_CARE_PROVIDER_SITE_OTHER): Payer: No Typology Code available for payment source

## 2021-09-07 DIAGNOSIS — J309 Allergic rhinitis, unspecified: Secondary | ICD-10-CM | POA: Diagnosis not present

## 2021-09-17 ENCOUNTER — Ambulatory Visit (INDEPENDENT_AMBULATORY_CARE_PROVIDER_SITE_OTHER): Payer: No Typology Code available for payment source

## 2021-09-17 DIAGNOSIS — J309 Allergic rhinitis, unspecified: Secondary | ICD-10-CM

## 2021-09-21 DIAGNOSIS — J301 Allergic rhinitis due to pollen: Secondary | ICD-10-CM | POA: Diagnosis not present

## 2021-09-21 NOTE — Progress Notes (Signed)
VIALS EXP 09-22-22 ?

## 2021-09-22 DIAGNOSIS — J3089 Other allergic rhinitis: Secondary | ICD-10-CM | POA: Diagnosis not present

## 2021-09-30 ENCOUNTER — Ambulatory Visit (INDEPENDENT_AMBULATORY_CARE_PROVIDER_SITE_OTHER): Payer: No Typology Code available for payment source

## 2021-09-30 DIAGNOSIS — J309 Allergic rhinitis, unspecified: Secondary | ICD-10-CM | POA: Diagnosis not present

## 2021-10-14 ENCOUNTER — Ambulatory Visit (INDEPENDENT_AMBULATORY_CARE_PROVIDER_SITE_OTHER): Payer: No Typology Code available for payment source

## 2021-10-14 DIAGNOSIS — J309 Allergic rhinitis, unspecified: Secondary | ICD-10-CM | POA: Diagnosis not present

## 2021-10-20 ENCOUNTER — Ambulatory Visit (INDEPENDENT_AMBULATORY_CARE_PROVIDER_SITE_OTHER): Payer: No Typology Code available for payment source

## 2021-10-20 DIAGNOSIS — J309 Allergic rhinitis, unspecified: Secondary | ICD-10-CM

## 2021-10-28 ENCOUNTER — Ambulatory Visit (INDEPENDENT_AMBULATORY_CARE_PROVIDER_SITE_OTHER): Payer: No Typology Code available for payment source

## 2021-10-28 DIAGNOSIS — J309 Allergic rhinitis, unspecified: Secondary | ICD-10-CM | POA: Diagnosis not present

## 2021-11-03 ENCOUNTER — Encounter (HOSPITAL_COMMUNITY): Payer: Self-pay

## 2021-11-03 ENCOUNTER — Ambulatory Visit (HOSPITAL_COMMUNITY)
Admission: EM | Admit: 2021-11-03 | Discharge: 2021-11-03 | Disposition: A | Discharge status: Home / Self Care | Payer: Non-veteran care

## 2021-11-03 DIAGNOSIS — Z794 Long term (current) use of insulin: Secondary | ICD-10-CM

## 2021-11-03 DIAGNOSIS — E1165 Type 2 diabetes mellitus with hyperglycemia: Secondary | ICD-10-CM | POA: Diagnosis not present

## 2021-11-03 DIAGNOSIS — R739 Hyperglycemia, unspecified: Secondary | ICD-10-CM

## 2021-11-03 LAB — CBG MONITORING, ED: Glucose-Capillary: 243 mg/dL — ABNORMAL HIGH (ref 70–99)

## 2021-11-03 NOTE — ED Triage Notes (Signed)
Pt presents today with a high glucose level. Pt states his sugar level was 400. Pt denies other symptoms.  ?

## 2021-11-03 NOTE — ED Provider Notes (Addendum)
?[facility] ? ? ? ?CSN: [id number] ?Arrival date & time: 11/03/21  1901 ? ? ?  ? ?History   ?Chief Complaint ?Chief Complaint  ?Patient presents with  ? Hyperglycemia  ? ? ?HPI ?Steven Figueroa is a 61 y.o. male.  ? ?The patient is a 61 year old male who presents for elevated blood glucose.  He states that around 2 hours ago, his blood sugar was 400 at home.  He states that he ate peanut butter and jelly prior to that.  He denies polyuria, polydipsia, polyphagia, headache, dizziness, nausea, or vomiting.  He states that he does have some fatigue at this time.  Patient currently takes metformin and Lantus for his diabetes.  He is scheduled to see a new endocrinologist on May 11.  He states he currently goes to the [place] for his care at this time.  He does have a history of hypertension and hyperlipidemia.  He was diagnosed with diabetes in the early 90s. ? ?The history is provided by the patient.  ? ?History reviewed. No pertinent past medical history. ? ?Patient Active Problem List  ? Diagnosis Date Noted  ? Seasonal and perennial allergic rhinoconjunctivitis 09/25/2020  ? ? ?Past Surgical History:  ?Procedure Laterality Date  ? BUNIONECTOMY    ? EYE SURGERY    ? UMBILICAL HERNIA REPAIR    ? ? ? ? ? ?Home Medications   ? ?Prior to Admission medications   ?Medication Sig Start Date End Date Taking? Authorizing Provider  ?insulin glargine (LANTUS) 100 UNIT/ML injection INJECT 8 UNITS SUBCUTANEOUSLY AT BEDTIME FOR DIABETES (USE WITHIN 28 DAYS AFTER OPENING VIAL) 08/31/20  Yes [provider]  ?Alogliptin Benzoate 25 MG TABS Take 1 tablet by mouth daily. 05/02/20   [provider]  ?amLODipine (NORVASC) 10 MG tablet TAKE ONE TABLET BY MOUTH DAILY FOR BLOOD PRESSURE 11/08/19   [provider]  ?aspirin 81 MG EC tablet TAKE ONE TABLET BY MOUTH DAILY FOR HEART 04/30/20   [provider]  ?azelastine (ASTELIN) 0.1 % nasal spray Place 1-2 sprays into both nostrils 2 (two) times daily  as needed (runny nose). Use in each nostril as directed 05/10/21   Ellamae Sia, DO  ?Brinzolamide-Brimonidine 1-0.2 % SUSP INSTILL 1 DROP IN BOTH EYES THREE TIMES A DAY 12/12/19   [provider]  ?cetirizine (ZYRTEC) 10 MG tablet Take 10 mg by mouth daily.    [provider]  ?Cholecalciferol 50 MCG (2000 UT) TABS Take 2 tablets by mouth daily. 11/26/19   [provider]  ?EPINEPHrine 0.3 mg/0.3 mL IJ SOAJ injection Inject 0.3 mLs (0.3 mg total) into the muscle as needed for anaphylaxis. 03/11/20   Ellamae Sia, DO  ?ferrous sulfate 325 (65 FE) MG tablet Take 1 tablet by mouth every other day. 05/07/20   [provider]  ?gabapentin (NEURONTIN) 300 MG capsule TAKE ONE CAPSULE BY MOUTH IN THE MORNING AND TAKE ONE CAPSULE AT NOON AND TAKE TWO CAPSULES AT BEDTIME 01/14/20   [provider]  ?glipiZIDE (GLUCOTROL) 5 MG tablet TAKE ONE TABLET BY MOUTH TWICE A DAY BEFORE MEALS (TAKE THIS MEDICATION 30 MINUTES PRIOR TO A MEAL) FOR DIABETES. MAY CAUSE LOW BLOOD GLUCOSE. 10/21/19   [provider]  ?glucose blood test strip USE ONE STRIP FOR TESTING BLOOD SUGAR CHECKS AS DIRECTED BY YOUR MEDICAL PROVIDER 09/17/20   [provider]  ?latanoprost (XALATAN) 0.005 % ophthalmic solution INSTILL 1 DROP IN BOTH EYES AT BEDTIME 05/11/20  [provider]  ?losartan (COZAAR) 100 MG tablet Take 1 tablet by mouth daily. 01/12/20   [provider]  ?Magnesium Oxide 420 MG TABS TAKE ONE TABLET BY MOUTH TWICE A DAY FOR LOW MAGNESIUM 04/30/20   [provider]  ?metFORMIN (GLUCOPHAGE) 850 MG tablet TAKE ONE TABLET BY MOUTH THREE TIMES A DAY WITH MEALS FOR DIABETES 02/03/20   [provider]  ?methazolamide (NEPTAZANE) 50 MG tablet Take 1 tablet by mouth 3 (three) times daily. 06/18/20   [provider]  ?modafinil (PROVIGIL) 200 MG tablet TAKE ONE TABLET BY MOUTH DAILY (EVERY MORNING) 09/02/20   [provider]  ?montelukast (SINGULAIR)  10 MG tablet Take 1 tablet by mouth daily. 11/18/19   [provider]  ?Omega-3 Fatty Acids (FISH OIL) 1000 MG CAPS Take 1 capsule by mouth 2 (two) times daily. 11/26/19   [provider]  ?Pediatric Multivitamins-Fl (MULTIVITAMINS/FL PO) Take 1 tablet by mouth daily. 02/06/20   [provider]  ?pioglitazone (ACTOS) 45 MG tablet TAKE ONE TABLET BY MOUTH DAILY FOR DIABETES 11/26/19   [provider]  ?pravastatin (PRAVACHOL) 20 MG tablet TAKE ONE TABLET BY MOUTH AT BEDTIME FOR CHOLESTEROL 11/26/19   [provider]  ?timolol (TIMOPTIC) 0.5 % ophthalmic solution INSTILL 1 DROP IN RIGHT EYE EVERY 12 HOURS 05/26/20   [provider]  ? ? ?Family History ?History reviewed. No pertinent family history. ? ?Social History ?Social History  ? ?Tobacco Use  ? Smoking status: Former  ? Smokeless tobacco: Never  ?Vaping Use  ? Vaping Use: Never used  ?Substance Use Topics  ? Alcohol use: Not Currently  ? Drug use: Never  ? ? ? ?Allergies   ?Codeine and Iodine ? ? ?Review of Systems ?Review of Systems  ?Constitutional:  Positive for fatigue. Negative for activity change, appetite change and fever.  ?Respiratory: Negative.    ?Cardiovascular: Negative.   ?Gastrointestinal: Negative.   ?Endocrine: Negative for polydipsia, polyphagia and polyuria.  ?Skin: Negative.   ?Psychiatric/Behavioral: Negative.    ? ? ?Physical Exam ?Triage Vital Signs ?ED Triage Vitals [11/03/21 1929]  ?Enc Vitals Group  ?   BP 129/86  ?   Pulse Rate 87  ?   Resp 17  ?   Temp 98.3 ?F (36.8 ?C)  ?   Temp Source Oral  ?   SpO2 96 %  ?   Weight   ?   Height   ?   Head Circumference   ?   Peak Flow   ?   Pain Score 0  ?   Pain Loc   ?   Pain Edu?   ?   Excl. in GC?   ? ?No data found. ? ?Updated Vital Signs ?BP 129/86 (BP Location: Right Arm)   Pulse 87   Temp 98.3 ?F (36.8 ?C) (Oral)   Resp 17   SpO2 96%  ? ?Visual Acuity ?Right Eye Distance:   ?Left Eye Distance:   ?Bilateral Distance:   ? ?Right Eye Near:    ?Left Eye Near:    ?Bilateral Near:    ? ?Physical Exam ?Vitals reviewed.  ?Constitutional:   ?   Appearance: Normal appearance.  ?HENT:  ?   Head: Normocephalic and atraumatic.  ?   Right Ear: Tympanic membrane, ear canal and external ear normal.  ?   Left Ear: Tympanic membrane, ear canal and external ear normal.  ?   Nose: Nose normal.  ?   Mouth/Throat:  ?  Mouth: Mucous membranes are moist.  ?Eyes:  ?   Extraocular Movements: Extraocular movements intact.  ?   Conjunctiva/sclera: Conjunctivae normal.  ?   Pupils: Pupils are equal, round, and reactive to light.  ?Cardiovascular:  ?   Rate and Rhythm: Normal rate and regular rhythm.  ?   Pulses: Normal pulses.  ?   Heart sounds: Normal heart sounds.  ?Pulmonary:  ?   Effort: Pulmonary effort is normal.  ?   Breath sounds: Normal breath sounds.  ?Abdominal:  ?   General: Bowel sounds are normal. There is no distension.  ?   Palpations: Abdomen is soft.  ?   Tenderness: There is no abdominal tenderness. There is no guarding.  ?Musculoskeletal:  ?   Cervical back: Normal range of motion.  ?Skin: ?   General: Skin is warm and dry.  ?   Capillary Refill: Capillary refill takes less than 2 seconds.  ?Neurological:  ?   General: No focal deficit present.  ?   Mental Status: He is alert and oriented to person, place, and time.  ?   Motor: No weakness.  ?   Coordination: Coordination normal.  ?Psychiatric:     ?   Mood and Affect: Mood normal.     ?   Behavior: Behavior normal.  ? ? ? ?UC Treatments / Results  ?Labs ?(all labs ordered are listed, but only abnormal results are displayed) ?Labs Reviewed  ?CBG MONITORING, ED - Abnormal; Notable for the following components:  ?    Result Value  ? Glucose-Capillary 243 (*)   ? All other components within normal limits  ? ? ?EKG ? ? ?Radiology ?No results found. ? ?Procedures ?Procedures (including critical care time) ? ?Medications Ordered in UC ?Medications - No data to display ? ?Initial Impression / Assessment and Plan /  UC Course  ?I have reviewed the triage vital signs and the nursing notes. ? ?Pertinent labs & imaging results that were available during my care of the patient were reviewed by me and considered in my med

## 2021-11-03 NOTE — Discharge Instructions (Addendum)
Your blood glucose was 243 today in the office. ?Continue your current medications to include metformin and Lantus. ?Increase fluids.  You should be drinking at least 64 ounces of water daily.  This will help lower your blood glucose level. ?Try to avoid simple sugars such as cookies, sodas, cakes, pies, ice cream, as these things will spike your blood glucose level. ?Continue to check your blood sugars twice daily as prescribed by your physician. ?Follow-up with endocrinology as scheduled on Nov 25, 2021. ?Follow-up in the ER for blood sugars greater than 450 or if you are experiencing blurred vision, altered mental status, uncontrolled nausea or vomiting, or other concerns. ? ?

## 2021-11-04 ENCOUNTER — Encounter (HOSPITAL_COMMUNITY): Payer: Self-pay | Admitting: Nurse Practitioner

## 2021-11-12 ENCOUNTER — Ambulatory Visit (INDEPENDENT_AMBULATORY_CARE_PROVIDER_SITE_OTHER): Payer: No Typology Code available for payment source | Admitting: *Deleted

## 2021-11-12 DIAGNOSIS — J309 Allergic rhinitis, unspecified: Secondary | ICD-10-CM

## 2021-11-19 ENCOUNTER — Ambulatory Visit (INDEPENDENT_AMBULATORY_CARE_PROVIDER_SITE_OTHER): Payer: No Typology Code available for payment source

## 2021-11-19 DIAGNOSIS — J309 Allergic rhinitis, unspecified: Secondary | ICD-10-CM

## 2021-12-02 ENCOUNTER — Ambulatory Visit (INDEPENDENT_AMBULATORY_CARE_PROVIDER_SITE_OTHER): Payer: No Typology Code available for payment source

## 2021-12-02 DIAGNOSIS — J309 Allergic rhinitis, unspecified: Secondary | ICD-10-CM | POA: Diagnosis not present

## 2021-12-22 ENCOUNTER — Ambulatory Visit (INDEPENDENT_AMBULATORY_CARE_PROVIDER_SITE_OTHER): Payer: No Typology Code available for payment source

## 2021-12-22 DIAGNOSIS — J309 Allergic rhinitis, unspecified: Secondary | ICD-10-CM

## 2022-01-06 ENCOUNTER — Ambulatory Visit (INDEPENDENT_AMBULATORY_CARE_PROVIDER_SITE_OTHER): Payer: No Typology Code available for payment source

## 2022-01-06 DIAGNOSIS — J309 Allergic rhinitis, unspecified: Secondary | ICD-10-CM

## 2022-01-12 DIAGNOSIS — J301 Allergic rhinitis due to pollen: Secondary | ICD-10-CM

## 2022-01-13 DIAGNOSIS — J3089 Other allergic rhinitis: Secondary | ICD-10-CM | POA: Diagnosis not present

## 2022-01-20 ENCOUNTER — Ambulatory Visit (INDEPENDENT_AMBULATORY_CARE_PROVIDER_SITE_OTHER): Payer: No Typology Code available for payment source

## 2022-01-20 DIAGNOSIS — J309 Allergic rhinitis, unspecified: Secondary | ICD-10-CM

## 2022-02-03 ENCOUNTER — Ambulatory Visit (INDEPENDENT_AMBULATORY_CARE_PROVIDER_SITE_OTHER): Payer: No Typology Code available for payment source | Admitting: *Deleted

## 2022-02-03 DIAGNOSIS — J309 Allergic rhinitis, unspecified: Secondary | ICD-10-CM

## 2022-02-25 ENCOUNTER — Ambulatory Visit (INDEPENDENT_AMBULATORY_CARE_PROVIDER_SITE_OTHER): Payer: No Typology Code available for payment source

## 2022-02-25 DIAGNOSIS — J309 Allergic rhinitis, unspecified: Secondary | ICD-10-CM | POA: Diagnosis not present

## 2022-03-10 ENCOUNTER — Ambulatory Visit (INDEPENDENT_AMBULATORY_CARE_PROVIDER_SITE_OTHER): Payer: No Typology Code available for payment source

## 2022-03-10 DIAGNOSIS — J309 Allergic rhinitis, unspecified: Secondary | ICD-10-CM

## 2022-03-31 ENCOUNTER — Ambulatory Visit (INDEPENDENT_AMBULATORY_CARE_PROVIDER_SITE_OTHER): Payer: No Typology Code available for payment source | Admitting: *Deleted

## 2022-03-31 DIAGNOSIS — J309 Allergic rhinitis, unspecified: Secondary | ICD-10-CM | POA: Diagnosis not present

## 2022-04-14 ENCOUNTER — Ambulatory Visit (INDEPENDENT_AMBULATORY_CARE_PROVIDER_SITE_OTHER): Payer: No Typology Code available for payment source

## 2022-04-14 DIAGNOSIS — J309 Allergic rhinitis, unspecified: Secondary | ICD-10-CM

## 2022-04-28 ENCOUNTER — Ambulatory Visit (INDEPENDENT_AMBULATORY_CARE_PROVIDER_SITE_OTHER): Payer: No Typology Code available for payment source

## 2022-04-28 DIAGNOSIS — J309 Allergic rhinitis, unspecified: Secondary | ICD-10-CM | POA: Diagnosis not present

## 2022-04-29 ENCOUNTER — Telehealth: Payer: Self-pay | Admitting: Allergy

## 2022-04-29 NOTE — Telephone Encounter (Signed)
Faxed renewal of authorization request to [facility], [phone] and emailed it to [email or web address].   Called and spoke to patient, advised patient to reach out to the [place] as well. Patient verbalized understanding.

## 2022-05-05 ENCOUNTER — Ambulatory Visit (INDEPENDENT_AMBULATORY_CARE_PROVIDER_SITE_OTHER): Payer: No Typology Code available for payment source | Admitting: *Deleted

## 2022-05-05 DIAGNOSIS — J309 Allergic rhinitis, unspecified: Secondary | ICD-10-CM

## 2022-05-13 ENCOUNTER — Ambulatory Visit (INDEPENDENT_AMBULATORY_CARE_PROVIDER_SITE_OTHER): Payer: No Typology Code available for payment source

## 2022-05-13 DIAGNOSIS — J309 Allergic rhinitis, unspecified: Secondary | ICD-10-CM | POA: Diagnosis not present

## 2022-05-25 ENCOUNTER — Ambulatory Visit (INDEPENDENT_AMBULATORY_CARE_PROVIDER_SITE_OTHER): Payer: No Typology Code available for payment source | Admitting: *Deleted

## 2022-05-25 DIAGNOSIS — J309 Allergic rhinitis, unspecified: Secondary | ICD-10-CM

## 2022-06-02 NOTE — Telephone Encounter (Signed)
[facility] to check on status of request. Was advised patient has an authorization of Sept. 2023 to Sept 2024. Advised rep I had not received authorization and our office has only been filing the authorization ending in [id number] dated 05-10-2021 to 05-10-2022.   I asked if I could speak to someone about getting new authorization's dates updated. Was directed to reach out to Nurse Gold, [phone].   Spoke to Nurse Gold who directed me to speak to Fleet Contras [phone]  Rosalita Levan, per her voicemail she is out of the office from Oct. 2023 to Feb. 2024.   Called Nurse Gold again and lvm advising her of Rachel's voicemail. Requested a call back.

## 2022-07-13 ENCOUNTER — Telehealth: Payer: Self-pay | Admitting: Allergy

## 2022-07-13 ENCOUNTER — Ambulatory Visit (INDEPENDENT_AMBULATORY_CARE_PROVIDER_SITE_OTHER): Payer: No Typology Code available for payment source

## 2022-07-13 DIAGNOSIS — J309 Allergic rhinitis, unspecified: Secondary | ICD-10-CM

## 2022-07-13 NOTE — Telephone Encounter (Signed)
Pt informed that we have not seen him since oct 2022 and was over due for a visit before any refill are sent in as it looks like we have not prescribed this before

## 2022-07-13 NOTE — Telephone Encounter (Signed)
Patient stated that he has been using Ipratropium Bromide nasal spray and it works great. Patient is requesting that our doctors order this nasal spray for him. Patients pharmacy is the [place]. Patients call back number is [phone]

## 2022-07-25 ENCOUNTER — Ambulatory Visit (INDEPENDENT_AMBULATORY_CARE_PROVIDER_SITE_OTHER): Payer: No Typology Code available for payment source

## 2022-07-25 DIAGNOSIS — J309 Allergic rhinitis, unspecified: Secondary | ICD-10-CM

## 2022-08-02 NOTE — Telephone Encounter (Signed)
Spoke to Nurse Peabody, [phone]  Was able to get authorization backdated. Authorization referral number: [id number]  Dates for authorization: 05-13-2022 to 05-14-2023.   Referral has been updated in system. Referral has also been indexed into media, with new dates.

## 2022-08-02 NOTE — Progress Notes (Signed)
Follow Up Note  RE: Steven Figueroa MRN: [id number] DOB: [birth year] Date of Office Visit: 08/03/2022  Referring provider: [facility] Primary care provider: [facility]  Chief Complaint: Follow-up (No other concerns)  History of Present Illness: I had the pleasure of seeing Steven Figueroa for a follow up visit at the [facility] on 08/03/2022. He is a 62 y.o. male, who is being followed for allergic rhinoconjunctivitis on AIT. His previous allergy office visit was on 05/10/2021 with Dr. Selena Batten. Today is a regular follow up visit.  Seasonal and perennial allergic rhinoconjunctivitis Currently on AIT with no issues. Rhinorrhea and sneezing is better on AIT.  Taking Singulair daily with good benefit.  Takes ipratropium nasal spray 1 spray per nostril BID with good benefit. No nosebleeds.  Assessment and Plan: Steven Figueroa is a 62 y.o. male with: Seasonal and perennial allergic rhinoconjunctivitis Past history - Perennial rhinoconjunctivitis symptoms for many years but worse the last year and during the spring.  No previous ENT evaluation.  Patient follows with ophthalmology for glaucoma injections. 2021 skin testing showed positive to grass, weed, ragweed, trees, mold, dust mites, dog. Started AIT on 04/01/2020 (G-W-RW-T, M-DM-D). Interim history -  Some improvement in symptoms with AIT. Takes Singulair daily and ipratropium nasal spray.  Continue environmental control measures as below. Continue Singulair (montelukast) 10mg  daily at night. Use Atrovent (ipratropium) 0.03% 1-2 sprays per nostril twice a day as needed for runny nose/drainage. Nasal saline spray (i.e., Simply Saline) or nasal saline lavage (i.e., NeilMed) is recommended as needed and prior to medicated nasal sprays. Continue allergy injections - given today.  No steroid nasal sprays due to glaucoma issues.  Return in about 1 year (around 08/04/2023).  Meds ordered this encounter  Medications    ipratropium (ATROVENT) 0.03 % nasal spray    Sig: Place 1-2 sprays into both nostrils 2 (two) times daily as needed (nasal drainage).    Dispense:  90 mL    Refill:  3   montelukast (SINGULAIR) 10 MG tablet    Sig: Take 1 tablet (10 mg total) by mouth at bedtime.    Dispense:  90 tablet    Refill:  3   Lab Orders  No laboratory test(s) ordered today    Diagnostics: None.   Medication List:  Current Outpatient Medications  Medication Sig Dispense Refill   Alogliptin Benzoate 25 MG TABS Take 1 tablet by mouth daily.     amLODipine (NORVASC) 10 MG tablet TAKE ONE TABLET BY MOUTH DAILY FOR BLOOD PRESSURE     aspirin 81 MG EC tablet TAKE ONE TABLET BY MOUTH DAILY FOR HEART     Brinzolamide-Brimonidine 1-0.2 % SUSP INSTILL 1 DROP IN BOTH EYES THREE TIMES A DAY     Cholecalciferol 50 MCG (2000 UT) TABS Take 2 tablets by mouth daily.     EPINEPHrine 0.3 mg/0.3 mL IJ SOAJ injection Inject 0.3 mLs (0.3 mg total) into the muscle as needed for anaphylaxis. 1 each 2   ferrous sulfate 325 (65 FE) MG tablet Take 1 tablet by mouth every other day.     gabapentin (NEURONTIN) 300 MG capsule TAKE ONE CAPSULE BY MOUTH IN THE MORNING AND TAKE ONE CAPSULE AT NOON AND TAKE TWO CAPSULES AT BEDTIME     glipiZIDE (GLUCOTROL) 5 MG tablet TAKE ONE TABLET BY MOUTH TWICE A DAY BEFORE MEALS (TAKE THIS MEDICATION 30 MINUTES PRIOR TO A MEAL) FOR DIABETES. MAY CAUSE LOW BLOOD GLUCOSE.  insulin glargine (LANTUS) 100 UNIT/ML injection INJECT 8 UNITS SUBCUTANEOUSLY AT BEDTIME FOR DIABETES (USE WITHIN 28 DAYS AFTER OPENING VIAL)     ipratropium (ATROVENT) 0.03 % nasal spray Place 1-2 sprays into both nostrils 2 (two) times daily as needed (nasal drainage). 90 mL 3   losartan (COZAAR) 100 MG tablet Take 1 tablet by mouth daily.     Magnesium Oxide 420 MG TABS TAKE ONE TABLET BY MOUTH TWICE A DAY FOR LOW MAGNESIUM     metFORMIN (GLUCOPHAGE) 850 MG tablet TAKE ONE TABLET BY MOUTH THREE TIMES A DAY WITH MEALS FOR DIABETES      methazolamide (NEPTAZANE) 50 MG tablet Take 1 tablet by mouth 3 (three) times daily.     modafinil (PROVIGIL) 200 MG tablet TAKE ONE TABLET BY MOUTH DAILY (EVERY MORNING)     montelukast (SINGULAIR) 10 MG tablet Take 1 tablet (10 mg total) by mouth at bedtime. 90 tablet 3   Omega-3 Fatty Acids (FISH OIL) 1000 MG CAPS Take 1 capsule by mouth 2 (two) times daily.     Pediatric Multivitamins-Fl (MULTIVITAMINS/FL PO) Take 1 tablet by mouth daily.     pioglitazone (ACTOS) 45 MG tablet TAKE ONE TABLET BY MOUTH DAILY FOR DIABETES     pravastatin (PRAVACHOL) 20 MG tablet TAKE ONE TABLET BY MOUTH AT BEDTIME FOR CHOLESTEROL     timolol (TIMOPTIC) 0.5 % ophthalmic solution INSTILL 1 DROP IN RIGHT EYE EVERY 12 HOURS     cetirizine (ZYRTEC) 10 MG tablet Take 10 mg by mouth daily. (Patient not taking: Reported on 08/03/2022)     glucose blood test strip USE ONE STRIP FOR TESTING BLOOD SUGAR CHECKS AS DIRECTED BY YOUR MEDICAL PROVIDER (Patient not taking: Reported on 08/03/2022)     latanoprost (XALATAN) 0.005 % ophthalmic solution INSTILL 1 DROP IN BOTH EYES AT BEDTIME (Patient not taking: Reported on 08/03/2022)     No current facility-administered medications for this visit.   Allergies: Allergies  Allergen Reactions   Codeine Itching   Iodine    Morphine Itching   I reviewed his past medical history, social history, family history, and environmental history and no significant changes have been reported from his previous visit.  Review of Systems  Constitutional:  Negative for appetite change, chills, fever and unexpected weight change.  HENT:  Negative for congestion, nosebleeds, postnasal drip and rhinorrhea.   Respiratory:  Negative for cough, chest tightness, shortness of breath and wheezing.   Cardiovascular:  Negative for chest pain.  Gastrointestinal:  Negative for abdominal pain.  Genitourinary:  Negative for difficulty urinating.  Skin:  Negative for rash.  Allergic/Immunologic: Positive  for environmental allergies.  Neurological:  Negative for headaches.    Objective: BP 120/80   Pulse 97   Temp 98.5 F (36.9 C)   Resp 16   Ht 6\' 1"  (1.854 m)   Wt 207 lb 1.6 oz (93.9 kg)   SpO2 96%   BMI 27.32 kg/m  Body mass index is 27.32 kg/m. Physical Exam Vitals and nursing note reviewed.  Constitutional:      Appearance: Normal appearance. He is well-developed.  HENT:     Head: Normocephalic and atraumatic.     Right Ear: Tympanic membrane and external ear normal.     Left Ear: Tympanic membrane and external ear normal.     Nose: Nose normal.     Mouth/Throat:     Mouth: Mucous membranes are moist.     Pharynx: Oropharynx is clear.  Eyes:  Conjunctiva/sclera: Conjunctivae normal.  Cardiovascular:     Rate and Rhythm: Normal rate and regular rhythm.     Heart sounds: Normal heart sounds. No murmur heard.    No friction rub. No gallop.  Pulmonary:     Effort: Pulmonary effort is normal.     Breath sounds: Normal breath sounds. No wheezing, rhonchi or rales.  Musculoskeletal:     Cervical back: Neck supple.  Skin:    General: Skin is warm.     Findings: No rash.  Neurological:     Mental Status: He is alert and oriented to person, place, and time.  Psychiatric:        Behavior: Behavior normal.    Previous notes and tests were reviewed. The plan was reviewed with the patient/family, and all questions/concerned were addressed.  It was my pleasure to see Steven Figueroa today and participate in his care. Please feel free to contact me with any questions or concerns.  Sincerely,  Rexene Alberts, DO Allergy & Immunology  [facility] office: [place] office: [phone]

## 2022-08-03 ENCOUNTER — Other Ambulatory Visit: Payer: Self-pay

## 2022-08-03 ENCOUNTER — Ambulatory Visit: Payer: Self-pay

## 2022-08-03 ENCOUNTER — Ambulatory Visit (INDEPENDENT_AMBULATORY_CARE_PROVIDER_SITE_OTHER): Payer: No Typology Code available for payment source | Admitting: Allergy

## 2022-08-03 ENCOUNTER — Encounter: Payer: Self-pay | Admitting: Allergy

## 2022-08-03 VITALS — BP 120/80 | HR 97 | Temp 98.5°F | Resp 16 | Ht 73.0 in | Wt 207.1 lb

## 2022-08-03 DIAGNOSIS — J309 Allergic rhinitis, unspecified: Secondary | ICD-10-CM

## 2022-08-03 DIAGNOSIS — J3089 Other allergic rhinitis: Secondary | ICD-10-CM

## 2022-08-03 DIAGNOSIS — H1013 Acute atopic conjunctivitis, bilateral: Secondary | ICD-10-CM | POA: Diagnosis not present

## 2022-08-03 MED ORDER — MONTELUKAST SODIUM 10 MG PO TABS: 10.0000 mg | ORAL_TABLET | Freq: Every day | ORAL | 3 refills | Status: DC

## 2022-08-03 MED ORDER — IPRATROPIUM BROMIDE 0.03 % NA SOLN: 1.0000 | Freq: Two times a day (BID) | NASAL | 3 refills | Status: DC | PRN

## 2022-08-03 NOTE — Patient Instructions (Addendum)
Environmental allergies Past skin testing showed: Positive to grass, weed, ragweed, trees, mold, dust mites, dog. Continue environmental control measures as below. Continue Singulair (montelukast) 10mg  daily at night. Use Atrovent (ipratropium) 0.03% 1-2 sprays per nostril twice a day as needed for runny nose/drainage. Nasal saline spray (i.e., Simply Saline) or nasal saline lavage (i.e., NeilMed) is recommended as needed and prior to medicated nasal sprays. Continue allergy injections - given today.   Follow up in 12 months or sooner if needed.   Reducing Pollen Exposure Pollen seasons: trees (spring), grass (summer) and ragweed/weeds (fall). Keep windows closed in your home and car to lower pollen exposure.  Install air conditioning in the bedroom and throughout the house if possible.  Avoid going out in dry windy days - especially early morning. Pollen counts are highest between 5 - 10 AM and on dry, hot and windy days.  Save outside activities for late afternoon or after a heavy rain, when pollen levels are lower.  Avoid mowing of grass if you have grass pollen allergy. Be aware that pollen can also be transported indoors on people and pets.  Dry your clothes in an automatic dryer rather than hanging them outside where they might collect pollen.  Rinse hair and eyes before bedtime. Mold Control Mold and fungi can grow on a variety of surfaces provided certain temperature and moisture conditions exist.  Outdoor molds grow on plants, decaying vegetation and soil. The major outdoor mold, Alternaria and Cladosporium, are found in very high numbers during hot and dry conditions. Generally, a late summer - fall peak is seen for common outdoor fungal spores. Rain will temporarily lower outdoor mold spore count, but counts rise rapidly when the rainy period ends. The most important indoor molds are Aspergillus and Penicillium. Dark, humid and poorly ventilated basements are ideal sites for mold  growth. The next most common sites of mold growth are the bathroom and the kitchen. Outdoor (Seasonal) Mold Control Use air conditioning and keep windows closed. Avoid exposure to decaying vegetation. Avoid leaf raking. Avoid grain handling. Consider wearing a face mask if working in moldy areas.  Indoor (Perennial) Mold Control  Maintain humidity below 50%. Get rid of mold growth on hard surfaces with water, detergent and, if necessary, 5% bleach (do not mix with other cleaners). Then dry the area completely. If mold covers an area more than 10 square feet, consider hiring an indoor environmental professional. For clothing, washing with soap and water is best. If moldy items cannot be cleaned and dried, throw them away. Remove sources e.g. contaminated carpets. Repair and seal leaking roofs or pipes. Using dehumidifiers in damp basements may be helpful, but empty the water and clean units regularly to prevent mildew from forming. All rooms, especially basements, bathrooms and kitchens, require ventilation and cleaning to deter mold and mildew growth. Avoid carpeting on concrete or damp floors, and storing items in damp areas. Control of House Dust Mite Allergen Dust mite allergens are a common trigger of allergy and asthma symptoms. While they can be found throughout the house, these microscopic creatures thrive in warm, humid environments such as bedding, upholstered furniture and carpeting. Because so much time is spent in the bedroom, it is essential to reduce mite levels there.  Encase pillows, mattresses, and box springs in special allergen-proof fabric covers or airtight, zippered plastic covers.  Bedding should be washed weekly in hot water (130 F) and dried in a hot dryer. Allergen-proof covers are available for comforters and pillows that  can't be regularly washed.  Wash the allergy-proof covers every few months. Minimize clutter in the bedroom. Keep pets out of the bedroom.  Keep  humidity less than 50% by using a dehumidifier or air conditioning. You can buy a humidity measuring device called a hygrometer to monitor this.  If possible, replace carpets with hardwood, linoleum, or washable area rugs. If that's not possible, vacuum frequently with a vacuum that has a HEPA filter. Remove all upholstered furniture and non-washable window drapes from the bedroom. Remove all non-washable stuffed toys from the bedroom.  Wash stuffed toys weekly. Pet Allergen Avoidance: Contrary to popular opinion, there are no "hypoallergenic" breeds of dogs or cats. That is because people are not allergic to an animal's hair, but to an allergen found in the animal's saliva, dander (dead skin flakes) or urine. Pet allergy symptoms typically occur within minutes. For some people, symptoms can build up and become most severe 8 to 12 hours after contact with the animal. People with severe allergies can experience reactions in public places if dander has been transported on the pet owners' clothing. Keeping an animal outdoors is only a partial solution, since homes with pets in the yard still have higher concentrations of animal allergens. Before getting a pet, ask your allergist to determine if you are allergic to animals. If your pet is already considered part of your family, try to minimize contact and keep the pet out of the bedroom and other rooms where you spend a great deal of time. As with dust mites, vacuum carpets often or replace carpet with a hardwood floor, tile or linoleum. High-efficiency particulate air (HEPA) cleaners can reduce allergen levels over time. While dander and saliva are the source of cat and dog allergens, urine is the source of allergens from rabbits, hamsters, mice and [place] pigs; so ask a non-allergic family member to clean the animal's cage. If you have a pet allergy, talk to your allergist about the potential for allergy immunotherapy (allergy shots). This strategy can  often provide long-term relief.

## 2022-08-03 NOTE — Assessment & Plan Note (Signed)
Past history - Perennial rhinoconjunctivitis symptoms for many years but worse the last year and during the spring.  No previous ENT evaluation.  Patient follows with ophthalmology for glaucoma injections. 2021 skin testing showed positive to grass, weed, ragweed, trees, mold, dust mites, dog. Started AIT on 04/01/2020 (G-W-RW-T, M-DM-D). Interim history -  Some improvement in symptoms with AIT. Takes Singulair daily and ipratropium nasal spray.  Continue environmental control measures as below. Continue Singulair (montelukast) 10mg  daily at night. Use Atrovent (ipratropium) 0.03% 1-2 sprays per nostril twice a day as needed for runny nose/drainage. Nasal saline spray (i.e., Simply Saline) or nasal saline lavage (i.e., NeilMed) is recommended as needed and prior to medicated nasal sprays. Continue allergy injections - given today.  No steroid nasal sprays due to glaucoma issues.

## 2022-08-09 ENCOUNTER — Ambulatory Visit (INDEPENDENT_AMBULATORY_CARE_PROVIDER_SITE_OTHER): Payer: No Typology Code available for payment source

## 2022-08-09 DIAGNOSIS — J309 Allergic rhinitis, unspecified: Secondary | ICD-10-CM | POA: Diagnosis not present

## 2022-08-17 ENCOUNTER — Ambulatory Visit (INDEPENDENT_AMBULATORY_CARE_PROVIDER_SITE_OTHER): Payer: No Typology Code available for payment source

## 2022-08-17 DIAGNOSIS — J309 Allergic rhinitis, unspecified: Secondary | ICD-10-CM

## 2022-08-25 ENCOUNTER — Ambulatory Visit (INDEPENDENT_AMBULATORY_CARE_PROVIDER_SITE_OTHER): Payer: No Typology Code available for payment source

## 2022-08-25 DIAGNOSIS — J309 Allergic rhinitis, unspecified: Secondary | ICD-10-CM

## 2022-09-05 ENCOUNTER — Ambulatory Visit (INDEPENDENT_AMBULATORY_CARE_PROVIDER_SITE_OTHER): Payer: No Typology Code available for payment source

## 2022-09-05 DIAGNOSIS — J309 Allergic rhinitis, unspecified: Secondary | ICD-10-CM | POA: Diagnosis not present

## 2022-11-26 IMAGING — DX DG FINGER THUMB 2+V*L*
3 series · 3 of 3 positions shown · non-contrast
Comparison: None.

CLINICAL DATA: First digit laceration

EXAM:
LEFT THUMB 2+V

[finger ap]
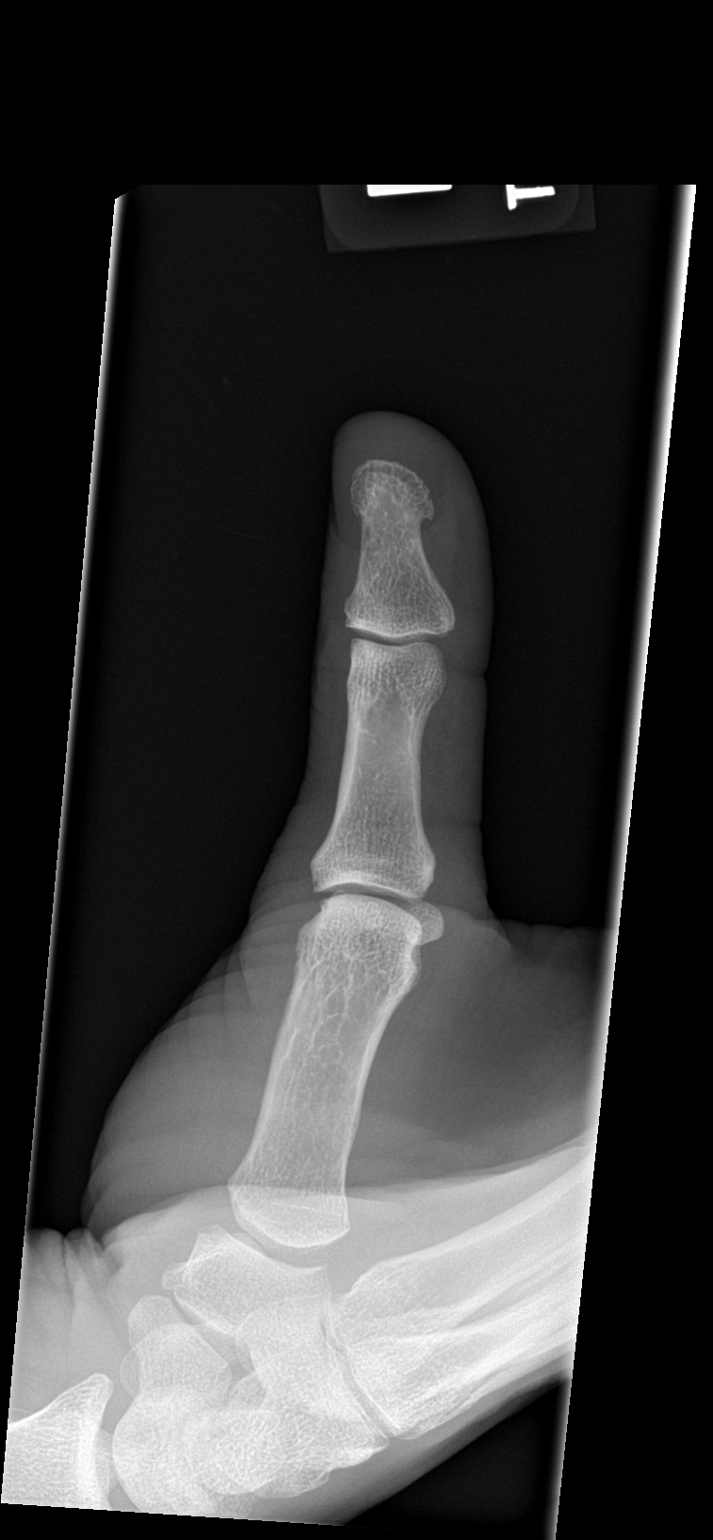

[finger obl]
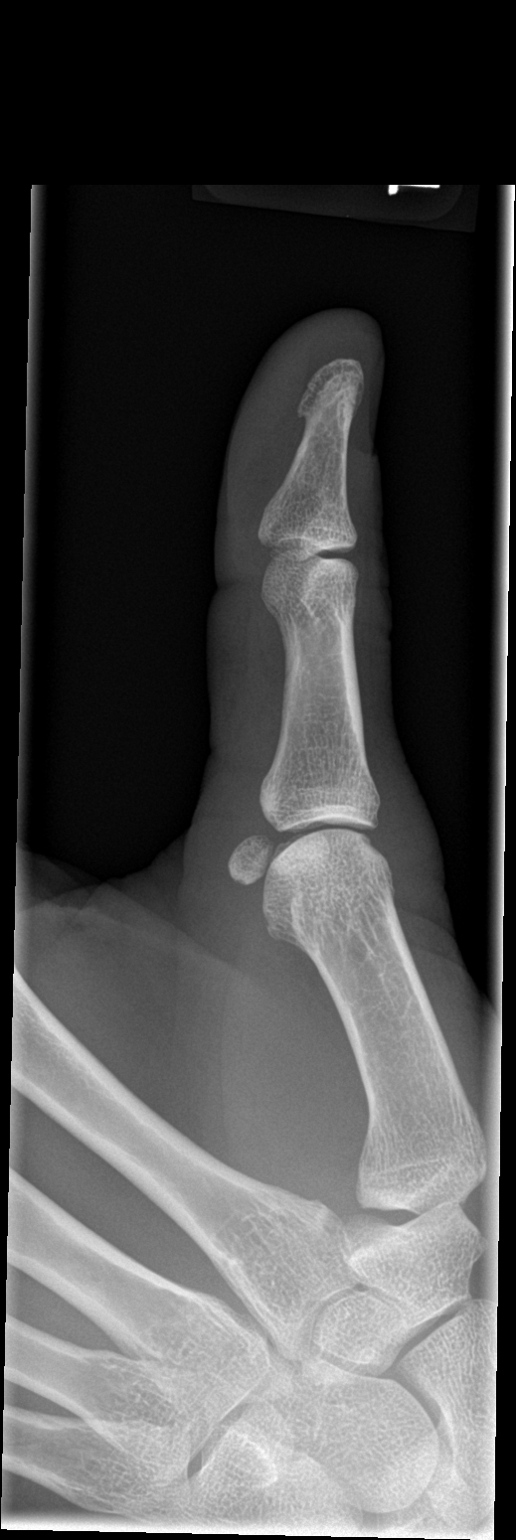

[finger lat]
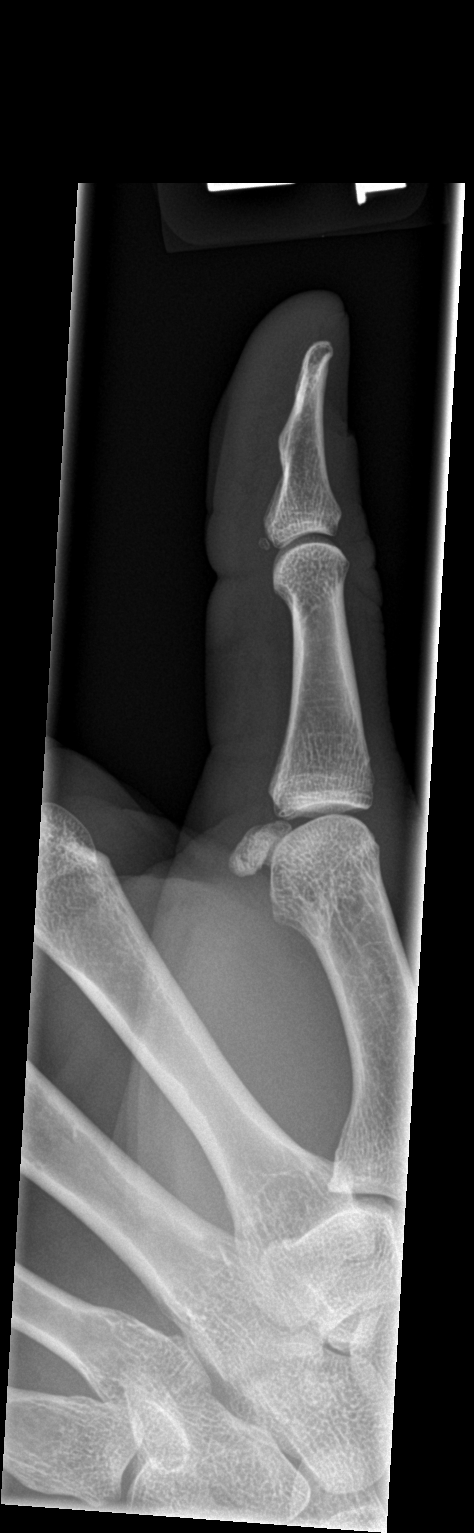

[3 of 3 positions shown; findings below may reference images not displayed]

FINDINGS: Frontal, oblique, and lateral views of the left first digit are
obtained. No fracture, subluxation, or dislocation. Joint spaces are
well preserved. Soft tissues are unremarkable. No radiopaque foreign
body.
IMPRESSION: 1. No fracture or radiopaque foreign body.

## 2023-01-18 ENCOUNTER — Other Ambulatory Visit: Payer: Self-pay | Admitting: Urology

## 2023-03-07 NOTE — Patient Instructions (Addendum)
SURGICAL WAITING ROOM VISITATION Patients having surgery or a procedure may have no more than 2 support people in the waiting area - these visitors may rotate.    Children under the age of 15 must have an adult with them who is not the patient.  If the patient needs to stay at the hospital during part of their recovery, the visitor guidelines for inpatient rooms apply. Pre-op nurse will coordinate an appropriate time for 1 support person to accompany patient in pre-op.  This support person may not rotate.    Please refer to the [facility] website for the visitor guidelines for Inpatients (after your surgery is over and you are in a regular room).       Your procedure is scheduled on: 03-21-23   Report to [facility] Main Entrance    Report to admitting at 5:15 AM   Call this number if you have problems the morning of surgery [phone]   Do not eat food or drink liquids:After Midnight.           If you have questions, please contact your surgeon's office.   FOLLOW  ANY ADDITIONAL PRE OP INSTRUCTIONS YOU RECEIVED FROM YOUR SURGEON'S OFFICE!!!     Oral Hygiene is also important to reduce your risk of infection.                                    Remember - BRUSH YOUR TEETH THE MORNING OF SURGERY WITH YOUR REGULAR TOOTHPASTE   Do NOT smoke after Midnight   Take these medicines the morning of surgery with A SIP OF WATER:   Amlodipine  AzaTHIOprine  Zyrtec  Gabapentin  Methazolamide  Okay to use nasal spray and eyedrops  Stop all vitamins and herbal supplements 7 days before surgery  How to Manage Your Diabetes Before and After Surgery  Why is it important to control my blood sugar before and after surgery? Improving blood sugar levels before and after surgery helps healing and can limit problems. A way of improving blood sugar control is eating a healthy diet by:  Eating less sugar and carbohydrates  Increasing activity/exercise  Talking with your doctor about  reaching your blood sugar goals High blood sugars (greater than 180 mg/dL) can raise your risk of infections and slow your recovery, so you will need to focus on controlling your diabetes during the weeks before surgery. Make sure that the doctor who takes care of your diabetes knows about your planned surgery including the date and location.  How do I manage my blood sugar before surgery? Check your blood sugar at least 4 times a day, starting 2 days before surgery, to make sure that the level is not too high or low. Check your blood sugar the morning of your surgery when you wake up and every 2 hours until you get to the Short Stay unit. If your blood sugar is less than 70 mg/dL, you will need to treat for low blood sugar: Do not take insulin. Treat a low blood sugar (less than 70 mg/dL) with  cup of clear juice (cranberry or apple), 4 glucose tablets, OR glucose gel. Recheck blood sugar in 15 minutes after treatment (to make sure it is greater than 70 mg/dL). If your blood sugar is not greater than 70 mg/dL on recheck, call [phone] for further instructions. Report your blood sugar to the short stay nurse when you get  to Short Stay.  If you are admitted to the hospital after surgery: Your blood sugar will be checked by the staff and you will probably be given insulin after surgery (instead of oral diabetes medicines) to make sure you have good blood sugar levels. The goal for blood sugar control after surgery is 80-180 mg/dL.   WHAT DO I DO ABOUT MY DIABETES MEDICATION?  Do not take oral diabetes medicines (pills) the morning of surgery.  THE MORNING OF SURGERY:  Take 11 units of Insulin Glargine (Lantus) . If your CBG is greater than 220 mg/dL, you may take  of your sliding scale (Insulin Aspart (Novolog) (correction) dose of insulin.  DO NOT TAKE THE FOLLOWING 7 DAYS PRIOR TO SURGERY: Ozempic, Wegovy, Rybelsus (Semaglutide), Byetta (exenatide), Bydureon (exenatide ER), Victoza,  Saxenda (liraglutide), or Trulicity (dulaglutide) Mounjaro (Tirzepatide) Adlyxin (Lixisenatide), Polyethylene Glycol Loxenatide.  Reviewed and Endorsed by [facility] Patient Education Committee, August 2015  Bring CPAP mask and tubing day of surgery.                              You may not have any metal on your body including jewelry, and body piercing             Do not wear lotions, powders, cologne, or deodorant              Men may shave face and neck.   Do not bring valuables to the hospital. [facility] IS NOT RESPONSIBLE   FOR VALUABLES.   Contacts, dentures or bridgework may not be worn into surgery.   Bring small overnight bag day of surgery.   DO NOT BRING YOUR HOME MEDICATIONS TO THE HOSPITAL. PHARMACY WILL DISPENSE MEDICATIONS LISTED ON YOUR MEDICATION LIST TO YOU DURING YOUR ADMISSION IN THE HOSPITAL!     Special Instructions: Bring a copy of your healthcare power of attorney and living will documents the day of surgery if you haven't scanned them before.              Please read over the following fact sheets you were given: IF YOU HAVE QUESTIONS ABOUT YOUR PRE-OP INSTRUCTIONS PLEASE CALL [phone] [facility]  If you received a COVID test during your pre-op visit  it is requested that you wear a mask when out in public, stay away from anyone that may not be feeling well and notify your surgeon if you develop symptoms. If you test positive for Covid or have been in contact with anyone that has tested positive in the last 10 days please notify you surgeon.   - Preparing for Surgery Before surgery, you can play an important role.  Because skin is not sterile, your skin needs to be as free of germs as possible.  You can reduce the number of germs on your skin by washing with CHG (chlorahexidine gluconate) soap before surgery.  CHG is an antiseptic cleaner which kills germs and bonds with the skin to continue killing germs even after washing. Please DO NOT use if you  have an allergy to CHG or antibacterial soaps.  If your skin becomes reddened/irritated stop using the CHG and inform your nurse when you arrive at Short Stay. Do not shave (including legs and underarms) for at least 48 hours prior to the first CHG shower.  You may shave your face/neck.  Please follow these instructions carefully:  1.  Shower with CHG Soap the night before surgery  and the  morning of surgery.  2.  If you choose to wash your hair, wash your hair first as usual with your normal  shampoo.  3.  After you shampoo, rinse your hair and body thoroughly to remove the shampoo.                             4.  Use CHG as you would any other liquid soap.  You can apply chg directly to the skin and wash.  Gently with a scrungie or clean washcloth.  5.  Apply the CHG Soap to your body ONLY FROM THE NECK DOWN.   Do   not use on face/ open                           Wound or open sores. Avoid contact with eyes, ears mouth and   genitals (private parts).                       Wash face,  Genitals (private parts) with your normal soap.             6.  Wash thoroughly, paying special attention to the area where your    surgery  will be performed.  7.  Thoroughly rinse your body with warm water from the neck down.  8.  DO NOT shower/wash with your normal soap after using and rinsing off the CHG Soap.                9.  Pat yourself dry with a clean towel.            10.  Wear clean pajamas.            11.  Place clean sheets on your bed the night of your first shower and do not  sleep with pets. Day of Surgery : Do not apply any lotions/deodorants the morning of surgery.  Please wear clean clothes to the hospital/surgery center.  FAILURE TO FOLLOW THESE INSTRUCTIONS MAY RESULT IN THE CANCELLATION OF YOUR SURGERY  PATIENT SIGNATURE_________________________________  NURSE SIGNATURE__________________________________  ________________________________________________________________________

## 2023-03-09 ENCOUNTER — Encounter (HOSPITAL_COMMUNITY): Payer: Self-pay

## 2023-03-09 ENCOUNTER — Encounter (HOSPITAL_COMMUNITY)
Admission: RE | Admit: 2023-03-09 | Discharge: 2023-03-09 | Disposition: A | Discharge status: Home / Self Care | Payer: No Typology Code available for payment source | Source: Ambulatory Visit | Attending: Urology | Admitting: Urology

## 2023-03-09 ENCOUNTER — Other Ambulatory Visit: Payer: Self-pay

## 2023-03-09 VITALS — BP 133/88 | HR 82 | Temp 98.4°F | Resp 16 | Ht 73.0 in | Wt 204.0 lb

## 2023-03-09 DIAGNOSIS — Z01818 Encounter for other preprocedural examination: Secondary | ICD-10-CM | POA: Diagnosis present

## 2023-03-09 DIAGNOSIS — D649 Anemia, unspecified: Secondary | ICD-10-CM

## 2023-03-09 DIAGNOSIS — Z794 Long term (current) use of insulin: Secondary | ICD-10-CM | POA: Diagnosis not present

## 2023-03-09 DIAGNOSIS — I10 Essential (primary) hypertension: Secondary | ICD-10-CM | POA: Diagnosis not present

## 2023-03-09 DIAGNOSIS — E119 Type 2 diabetes mellitus without complications: Secondary | ICD-10-CM | POA: Diagnosis not present

## 2023-03-09 HISTORY — DX: Unspecified osteoarthritis, unspecified site: M19.90

## 2023-03-09 HISTORY — DX: Essential (primary) hypertension: I10

## 2023-03-09 HISTORY — DX: Anemia, unspecified: D64.9

## 2023-03-09 HISTORY — DX: Type 2 diabetes mellitus without complications: E11.9

## 2023-03-09 HISTORY — DX: Methicillin resistant Staphylococcus aureus infection, unspecified site: A49.02

## 2023-03-09 LAB — BASIC METABOLIC PANEL
Anion gap: 6 (ref 5–15)
BUN: 13 mg/dL (ref 8–23)
CO2: 23 mmol/L (ref 22–32)
Calcium: 9.1 mg/dL (ref 8.9–10.3)
Chloride: 110 mmol/L (ref 98–111)
Creatinine, Ser: 1.25 mg/dL — ABNORMAL HIGH (ref 0.61–1.24)
GFR, Estimated: >60 mL/min (ref >60)
Glucose, Bld: 146 mg/dL — ABNORMAL HIGH (ref 70–99)
Potassium: 4 mmol/L (ref 3.5–5.1)
Sodium: 139 mmol/L (ref 135–145)

## 2023-03-09 LAB — CBC
HCT: 42.1 % (ref 39.0–52.0)
Hemoglobin: 13.6 g/dL (ref 13.0–17.0)
MCH: 31.1 pg (ref 26.0–34.0)
MCHC: 32.3 g/dL (ref 30.0–36.0)
MCV: 96.1 fL (ref 80.0–100.0)
Platelets: 246 10*3/uL (ref 150–400)
RBC: 4.38 MIL/uL (ref 4.22–5.81)
RDW: 14.6 % (ref 11.5–15.5)
WBC: 4 10*3/uL (ref 4.0–10.5)
nRBC: 0 % (ref 0.0–0.2)

## 2023-03-09 LAB — HEMOGLOBIN A1C
Hgb A1c MFr Bld: 8 % — ABNORMAL HIGH (ref 4.8–5.6)
Mean Plasma Glucose: 182.9 mg/dL

## 2023-03-09 LAB — GLUCOSE, CAPILLARY: Glucose-Capillary: 146 mg/dL — ABNORMAL HIGH (ref 70–99)

## 2023-03-09 LAB — SURGICAL PCR SCREEN
MRSA, PCR: NEGATIVE
Staphylococcus aureus: NEGATIVE

## 2023-03-09 NOTE — Progress Notes (Signed)
COVID Vaccine Completed:  Yes  Date of COVID positive in last 90 days:  No  [facility] [place] Cardiologist - N/A  Chest x-ray - N/A EKG - 03-09-23 Epic Stress Test - N/A ECHO - N/A Cardiac Cath - N/A Pacemaker/ICD device last checked: Spinal Cord Stimulator:N/A  Bowel Prep - N/A  Sleep Study - Yes, +sleep apnea CPAP - No  Fasting Blood Sugar - 100 to 160 Checks Blood Sugar - 3 times a day  Last dose of GLP1 agonist-  N/A GLP1 instructions:  N/A   Last dose of SGLT-2 inhibitors-  N/A SGLT-2 instructions: N/A  Blood Thinner Instructions:  Time Aspirin Instructions:  ASA 81. To stop 5 days before per patient Last Dose:  Activity level:  Can go up a flight of stairs and perform activities of daily living without stopping and without symptoms of chest pain or shortness of breath.   Anesthesia review: N/A  Patient denies shortness of breath, fever, cough and chest pain at PAT appointment  Patient verbalized understanding of instructions that were given to them at the PAT appointment. Patient was also instructed that they will need to review over the PAT instructions again at home before surgery.

## 2023-03-10 LAB — URINE CULTURE: Culture: NO GROWTH

## 2023-03-15 ENCOUNTER — Telehealth: Payer: Self-pay | Admitting: Allergy

## 2023-03-15 NOTE — Telephone Encounter (Signed)
Please call patient and have him make a sooner follow up appointment to discuss about restarting allergy injections.  Thank you.

## 2023-03-15 NOTE — Telephone Encounter (Signed)
Pt changed to sept 11 at 1030

## 2023-03-15 NOTE — Telephone Encounter (Signed)
Called patient as he is due for his yearly visit In Jan 2025. Patient scheduled yearly follow up with Dr. Selena Batten.   Asked patient if he was interested in continuing injections as his last injection was in Feb 2024.Patient stated he is definitely interested in continuing and he was planning on coming into office today to start the injections again. Patient had surgery on his foot and was dealing with that and forgot about the injections.  I spoke to Ashleigh to see what the next steps would be for patient to continue injections. Patient's last set of vials have expired and patient will need to restart injections as he has not come in 6 months.    Spoke with patient and advised him of expired vials and that he would need to restart injections. Advised him I would reach out to Dr. Selena Batten to ask if he needs a sooner visit or if okay to schedule restart appointment. Patient verbalized understanding.

## 2023-03-20 MED ORDER — GENTAMICIN SULFATE 40 MG/ML IJ SOLN
5.0000 mg/kg | INTRAVENOUS | Status: AC
  Administered 2023-03-21: 460 mg via INTRAVENOUS
  Filled 2023-03-20: qty 11.5

## 2023-03-21 ENCOUNTER — Other Ambulatory Visit: Payer: Self-pay

## 2023-03-21 ENCOUNTER — Ambulatory Visit (HOSPITAL_COMMUNITY)
Admission: RE | Admit: 2023-03-21 | Discharge: 2023-03-21 | Disposition: A | Discharge status: Home / Self Care | Payer: No Typology Code available for payment source | Source: Ambulatory Visit | Attending: Urology | Admitting: Urology

## 2023-03-21 ENCOUNTER — Encounter (HOSPITAL_COMMUNITY)
Admission: RE | Disposition: A | Discharge status: Home / Self Care | Payer: Self-pay | Source: Ambulatory Visit | Attending: Urology

## 2023-03-21 ENCOUNTER — Emergency Department (HOSPITAL_COMMUNITY)
Admission: EM | Admit: 2023-03-21 | Discharge: 2023-03-21 | Disposition: A | Discharge status: Home / Self Care | Payer: No Typology Code available for payment source | Attending: Emergency Medicine | Admitting: Emergency Medicine

## 2023-03-21 ENCOUNTER — Ambulatory Visit (HOSPITAL_BASED_OUTPATIENT_CLINIC_OR_DEPARTMENT_OTHER): Payer: No Typology Code available for payment source | Admitting: Certified Registered"

## 2023-03-21 ENCOUNTER — Encounter (HOSPITAL_COMMUNITY): Payer: Self-pay

## 2023-03-21 ENCOUNTER — Ambulatory Visit (HOSPITAL_COMMUNITY): Payer: No Typology Code available for payment source | Admitting: Certified Registered"

## 2023-03-21 ENCOUNTER — Encounter (HOSPITAL_COMMUNITY): Payer: Self-pay | Admitting: Urology

## 2023-03-21 DIAGNOSIS — E119 Type 2 diabetes mellitus without complications: Secondary | ICD-10-CM | POA: Insufficient documentation

## 2023-03-21 DIAGNOSIS — Z7984 Long term (current) use of oral hypoglycemic drugs: Secondary | ICD-10-CM | POA: Insufficient documentation

## 2023-03-21 DIAGNOSIS — Z79899 Other long term (current) drug therapy: Secondary | ICD-10-CM | POA: Insufficient documentation

## 2023-03-21 DIAGNOSIS — N529 Male erectile dysfunction, unspecified: Secondary | ICD-10-CM

## 2023-03-21 DIAGNOSIS — Z7982 Long term (current) use of aspirin: Secondary | ICD-10-CM | POA: Diagnosis not present

## 2023-03-21 DIAGNOSIS — N528 Other male erectile dysfunction: Secondary | ICD-10-CM | POA: Diagnosis present

## 2023-03-21 DIAGNOSIS — Z794 Long term (current) use of insulin: Secondary | ICD-10-CM | POA: Insufficient documentation

## 2023-03-21 DIAGNOSIS — M1990 Unspecified osteoarthritis, unspecified site: Secondary | ICD-10-CM | POA: Diagnosis not present

## 2023-03-21 DIAGNOSIS — Z87891 Personal history of nicotine dependence: Secondary | ICD-10-CM | POA: Insufficient documentation

## 2023-03-21 DIAGNOSIS — I10 Essential (primary) hypertension: Secondary | ICD-10-CM | POA: Insufficient documentation

## 2023-03-21 DIAGNOSIS — Z4800 Encounter for change or removal of nonsurgical wound dressing: Secondary | ICD-10-CM | POA: Insufficient documentation

## 2023-03-21 DIAGNOSIS — Z5189 Encounter for other specified aftercare: Secondary | ICD-10-CM

## 2023-03-21 HISTORY — PX: PENILE PROSTHESIS IMPLANT: SHX240

## 2023-03-21 LAB — GLUCOSE, CAPILLARY
Glucose-Capillary: 144 mg/dL — ABNORMAL HIGH (ref 70–99)
Glucose-Capillary: 161 mg/dL — ABNORMAL HIGH (ref 70–99)
Glucose-Capillary: 121 mg/dL — ABNORMAL HIGH (ref 70–99)

## 2023-03-21 SURGERY — INSERTION, PENILE PROSTHESIS, INFLATABLE
Anesthesia: General | Site: Penis

## 2023-03-21 MED ORDER — MIDAZOLAM HCL 5 MG/5ML IJ SOLN
INTRAMUSCULAR | Status: DC | PRN
  Administered 2023-03-21: 2 mg via INTRAVENOUS

## 2023-03-21 MED ORDER — IRRISEPT - 450ML BOTTLE WITH 0.05% CHG IN STERILE WATER, USP 99.95% OPTIME
TOPICAL | Status: DC | PRN
  Administered 2023-03-21: 900 mL via TOPICAL

## 2023-03-21 MED ORDER — ROCURONIUM BROMIDE 100 MG/10ML IV SOLN
INTRAVENOUS | Status: DC | PRN
  Administered 2023-03-21: 60 mg via INTRAVENOUS

## 2023-03-21 MED ORDER — AMISULPRIDE (ANTIEMETIC) 5 MG/2ML IV SOLN: 10.0000 mg | Freq: Once | INTRAVENOUS | Status: DC | PRN

## 2023-03-21 MED ORDER — SODIUM CHLORIDE 0.9 % IR SOLN
Status: DC | PRN
Start: 2023-03-21 — End: 2023-03-21
  Administered 2023-03-21: 2000 mL

## 2023-03-21 MED ORDER — FENTANYL CITRATE PF 50 MCG/ML IJ SOSY: 25.0000 ug | PREFILLED_SYRINGE | INTRAMUSCULAR | Status: DC | PRN

## 2023-03-21 MED ORDER — ACETAMINOPHEN 500 MG PO TABS
1000.0000 mg | ORAL_TABLET | Freq: Once | ORAL | Status: AC
  Administered 2023-03-21: 1000 mg via ORAL
  Filled 2023-03-21: qty 2

## 2023-03-21 MED ORDER — SODIUM CHLORIDE 0.9 % IV SOLN
INTRAVENOUS | Status: DC
  Filled 2023-03-21: qty 10

## 2023-03-21 MED ORDER — LIDOCAINE HCL 1 % IJ SOLN
INTRAMUSCULAR | Status: DC | PRN
  Administered 2023-03-21: 10 mL

## 2023-03-21 MED ORDER — DIPHENHYDRAMINE HCL 50 MG/ML IJ SOLN
INTRAMUSCULAR | Status: AC
  Filled 2023-03-21: qty 1

## 2023-03-21 MED ORDER — VANCOMYCIN HCL 1 G IV SOLR
INTRAVENOUS | Status: DC | PRN
  Administered 2023-03-21: 1000 mL

## 2023-03-21 MED ORDER — LACTATED RINGERS IV SOLN: INTRAVENOUS | Status: DC

## 2023-03-21 MED ORDER — DEXAMETHASONE SODIUM PHOSPHATE 10 MG/ML IJ SOLN
INTRAMUSCULAR | Status: AC
  Filled 2023-03-21: qty 1

## 2023-03-21 MED ORDER — ONDANSETRON HCL 4 MG/2ML IJ SOLN
INTRAMUSCULAR | Status: DC | PRN
  Administered 2023-03-21: 4 mg via INTRAVENOUS

## 2023-03-21 MED ORDER — BUPIVACAINE HCL 0.5 % IJ SOLN
INTRAMUSCULAR | Status: DC | PRN
  Administered 2023-03-21: 10 mL

## 2023-03-21 MED ORDER — VANCOMYCIN HCL 1500 MG/300ML IV SOLN
1500.0000 mg | INTRAVENOUS | Status: AC
  Administered 2023-03-21: 1500 mg via INTRAVENOUS
  Filled 2023-03-21: qty 300

## 2023-03-21 MED ORDER — ROCURONIUM BROMIDE 10 MG/ML (PF) SYRINGE
PREFILLED_SYRINGE | INTRAVENOUS | Status: AC
  Filled 2023-03-21: qty 10

## 2023-03-21 MED ORDER — PROPOFOL 10 MG/ML IV BOLUS
INTRAVENOUS | Status: AC
  Filled 2023-03-21: qty 20

## 2023-03-21 MED ORDER — DEXAMETHASONE SODIUM PHOSPHATE 10 MG/ML IJ SOLN
INTRAMUSCULAR | Status: DC | PRN
  Administered 2023-03-21: 4 mg via INTRAVENOUS

## 2023-03-21 MED ORDER — PHENYLEPHRINE 80 MCG/ML (10ML) SYRINGE FOR IV PUSH (FOR BLOOD PRESSURE SUPPORT)
PREFILLED_SYRINGE | INTRAVENOUS | Status: AC
  Filled 2023-03-21: qty 10

## 2023-03-21 MED ORDER — ORAL CARE MOUTH RINSE: 15.0000 mL | Freq: Once | OROMUCOSAL | Status: AC

## 2023-03-21 MED ORDER — CELECOXIB 200 MG PO CAPS
200.0000 mg | ORAL_CAPSULE | Freq: Two times a day (BID) | ORAL | 1 refills | Status: AC
Start: 2023-03-21 — End: 2023-04-18

## 2023-03-21 MED ORDER — DIPHENHYDRAMINE HCL 50 MG/ML IJ SOLN
INTRAMUSCULAR | Status: DC | PRN
Start: 2023-03-21 — End: 2023-03-21
  Administered 2023-03-21: 12.5 mg via INTRAVENOUS

## 2023-03-21 MED ORDER — CHLORHEXIDINE GLUCONATE 4 % EX SOLN: Freq: Once | CUTANEOUS | Status: DC

## 2023-03-21 MED ORDER — BUPIVACAINE HCL (PF) 0.5 % IJ SOLN
INTRAMUSCULAR | Status: AC
  Filled 2023-03-21: qty 30

## 2023-03-21 MED ORDER — LIDOCAINE HCL (CARDIAC) PF 100 MG/5ML IV SOSY
PREFILLED_SYRINGE | INTRAVENOUS | Status: DC | PRN
  Administered 2023-03-21: 80 mg via INTRAVENOUS

## 2023-03-21 MED ORDER — LIDOCAINE HCL (PF) 2 % IJ SOLN
INTRAMUSCULAR | Status: AC
  Filled 2023-03-21: qty 5

## 2023-03-21 MED ORDER — FENTANYL CITRATE (PF) 250 MCG/5ML IJ SOLN
INTRAMUSCULAR | Status: DC | PRN
Start: 2023-03-21 — End: 2023-03-21
  Administered 2023-03-21: 50 ug via INTRAVENOUS
  Administered 2023-03-21: 25 ug via INTRAVENOUS
  Administered 2023-03-21: 50 ug via INTRAVENOUS
  Administered 2023-03-21: 25 ug via INTRAVENOUS

## 2023-03-21 MED ORDER — ACETAMINOPHEN 500 MG PO TABS: 1000.0000 mg | ORAL_TABLET | Freq: Four times a day (QID) | ORAL | 0 refills | Status: AC

## 2023-03-21 MED ORDER — FLUCONAZOLE IN SODIUM CHLORIDE 200-0.9 MG/100ML-% IV SOLN
200.0000 mg | INTRAVENOUS | Status: DC
  Administered 2023-03-21: 200 mg via INTRAVENOUS
  Filled 2023-03-21: qty 100

## 2023-03-21 MED ORDER — STERILE WATER FOR IRRIGATION IR SOLN
Status: DC | PRN
Start: 2023-03-21 — End: 2023-03-21
  Administered 2023-03-21: 500 mL

## 2023-03-21 MED ORDER — ONDANSETRON HCL 4 MG/2ML IJ SOLN
INTRAMUSCULAR | Status: AC
  Filled 2023-03-21: qty 2

## 2023-03-21 MED ORDER — PROPOFOL 10 MG/ML IV BOLUS
INTRAVENOUS | Status: DC | PRN
  Administered 2023-03-21: 130 mg via INTRAVENOUS

## 2023-03-21 MED ORDER — LIDOCAINE HCL 1 % IJ SOLN
INTRAMUSCULAR | Status: AC
  Filled 2023-03-21: qty 20

## 2023-03-21 MED ORDER — CHLORHEXIDINE GLUCONATE 0.12 % MT SOLN
15.0000 mL | Freq: Once | OROMUCOSAL | Status: AC
  Administered 2023-03-21: 15 mL via OROMUCOSAL

## 2023-03-21 MED ORDER — FENTANYL CITRATE (PF) 250 MCG/5ML IJ SOLN
INTRAMUSCULAR | Status: AC
  Filled 2023-03-21: qty 5

## 2023-03-21 MED ORDER — PHENYLEPHRINE HCL (PRESSORS) 10 MG/ML IV SOLN
INTRAVENOUS | Status: DC | PRN
Start: 2023-03-21 — End: 2023-03-21
  Administered 2023-03-21: 80 ug via INTRAVENOUS
  Administered 2023-03-21: 40 ug via INTRAVENOUS
  Administered 2023-03-21 (×2): 80 ug via INTRAVENOUS
  Administered 2023-03-21: 40 ug via INTRAVENOUS
  Administered 2023-03-21 (×4): 80 ug via INTRAVENOUS

## 2023-03-21 MED ORDER — INSULIN ASPART 100 UNIT/ML IJ SOLN
0.0000 [IU] | INTRAMUSCULAR | Status: DC | PRN
  Administered 2023-03-21: 2 [IU] via SUBCUTANEOUS

## 2023-03-21 MED ORDER — ONDANSETRON HCL 4 MG/2ML IJ SOLN: 4.0000 mg | Freq: Once | INTRAMUSCULAR | Status: DC | PRN

## 2023-03-21 MED ORDER — MIDAZOLAM HCL 2 MG/2ML IJ SOLN
INTRAMUSCULAR | Status: AC
  Filled 2023-03-21: qty 2

## 2023-03-21 MED ORDER — OXYCODONE HCL 5 MG PO TABS: 5.0000 mg | ORAL_TABLET | Freq: Four times a day (QID) | ORAL | 0 refills | Status: DC | PRN

## 2023-03-21 MED ORDER — SULFAMETHOXAZOLE-TRIMETHOPRIM 800-160 MG PO TABS: 1.0000 | ORAL_TABLET | Freq: Two times a day (BID) | ORAL | 0 refills | Status: AC

## 2023-03-21 MED ORDER — MUPIROCIN 2 % EX OINT
1.0000 | TOPICAL_OINTMENT | Freq: Once | CUTANEOUS | Status: AC
  Administered 2023-03-21: 1 via NASAL
  Filled 2023-03-21: qty 22

## 2023-03-21 MED ORDER — SUGAMMADEX SODIUM 200 MG/2ML IV SOLN
INTRAVENOUS | Status: DC | PRN
Start: 2023-03-21 — End: 2023-03-21
  Administered 2023-03-21: 185 mg via INTRAVENOUS

## 2023-03-21 SURGICAL SUPPLY — 56 items
ADH SKN CLS APL DERMABOND .7 (GAUZE/BANDAGES/DRESSINGS) ×1
APL PRP STRL LF DISP 70% ISPRP (MISCELLANEOUS) ×2
BAG DRN RND TRDRP ANRFLXCHMBR (UROLOGICAL SUPPLIES) ×1
BAG URINE DRAIN 2000ML AR STRL (UROLOGICAL SUPPLIES) ×1 IMPLANT
BLADE SURG 15 STRL LF DISP TIS (BLADE) IMPLANT
BLADE SURG 15 STRL SS (BLADE)
BNDG GAUZE DERMACEA FLUFF 4 (GAUZE/BANDAGES/DRESSINGS) ×1 IMPLANT
BNDG GZE DERMACEA 4 6PLY (GAUZE/BANDAGES/DRESSINGS) ×1
BRIEF MESH DISP LRG (UNDERPADS AND DIAPERS) ×1 IMPLANT
CATH COUDE 5CC RIBBED (CATHETERS) ×1 IMPLANT
CATH RIBBED COUDE 5CC (CATHETERS) ×1
CHLORAPREP W/TINT 26 (MISCELLANEOUS) ×2 IMPLANT
COVER MAYO STAND STRL (DRAPES) ×1 IMPLANT
COVER SURGICAL LIGHT HANDLE (MISCELLANEOUS) ×1 IMPLANT
DERMABOND ADVANCED .7 DNX12 (GAUZE/BANDAGES/DRESSINGS) ×1 IMPLANT
DRAIN CHANNEL 10F 3/8 F FF (DRAIN) IMPLANT
DRAIN RELI 100 BL SUC LF ST (DRAIN) ×1
DRAPE INCISE IOBAN 66X45 STRL (DRAPES) ×1 IMPLANT
DRAPE LAPAROTOMY T 98X78 PEDS (DRAPES) ×1 IMPLANT
DRSG TEGADERM 4X4.75 (GAUZE/BANDAGES/DRESSINGS) ×1 IMPLANT
ELECT REM PT RETURN 15FT ADLT (MISCELLANEOUS) ×1 IMPLANT
EVACUATOR SILICONE 100CC (DRAIN) ×1 IMPLANT
GAUZE SPONGE 2X2 8PLY STRL LF (GAUZE/BANDAGES/DRESSINGS) IMPLANT
GLOVE BIO SURGEON STRL SZ7 (GLOVE) ×1 IMPLANT
GLOVE BIOGEL PI IND STRL 7.0 (GLOVE) ×1 IMPLANT
GOWN STRL REUS W/ TWL LRG LVL3 (GOWN DISPOSABLE) ×1 IMPLANT
GOWN STRL REUS W/TWL LRG LVL3 (GOWN DISPOSABLE) ×1
HOLDER FOLEY CATH W/STRAP (MISCELLANEOUS) ×1 IMPLANT
JET LAVAGE IRRISEPT WOUND (IRRIGATION / IRRIGATOR)
KIT BASIN OR (CUSTOM PROCEDURE TRAY) ×1 IMPLANT
KIT TITAN ASSEMBLY STANDARD (Erectile Restoration) ×1 IMPLANT
KIT TITAN ASSEMBLY STD (Erectile Restoration) IMPLANT
KIT TURNOVER KIT A (KITS) IMPLANT
LAVAGE JET IRRISEPT WOUND (IRRIGATION / IRRIGATOR) IMPLANT
NDL HYPO 22X1.5 SAFETY MO (MISCELLANEOUS) ×1 IMPLANT
NEEDLE HYPO 22X1.5 SAFETY MO (MISCELLANEOUS) ×1 IMPLANT
NS IRRIG 1000ML POUR BTL (IV SOLUTION) ×1 IMPLANT
PACK GENERAL/GYN (CUSTOM PROCEDURE TRAY) ×1 IMPLANT
PLUG CATH AND CAP STRL 200 (CATHETERS) ×1 IMPLANT
PUMP TITAN TOUCH (Stimulator) IMPLANT
RESERVOIR TITAN 12.5CC W/VLV IMPLANT
RETRACTOR WILSON COLOPLAST (INSTRUMENTS) IMPLANT
RETRACTOR WILSON SYSTEM (INSTRUMENTS) IMPLANT
SET CYLNDR ZERO ANGLE XL 28 (UROLOGICAL SUPPLIES) IMPLANT
SURGILUBE 2OZ TUBE FLIPTOP (MISCELLANEOUS) IMPLANT
SUT ETHILON 3 0 PS 1 (SUTURE) IMPLANT
SUT MNCRL AB 4-0 PS2 18 (SUTURE) ×1 IMPLANT
SUT VIC AB 2-0 UR6 27 (SUTURE) ×4 IMPLANT
SUT VIC AB 3-0 SH 27 (SUTURE) ×3
SUT VIC AB 3-0 SH 27X BRD (SUTURE) ×2 IMPLANT
SYR 10ML LL (SYRINGE) ×2 IMPLANT
SYR 50ML LL SCALE MARK (SYRINGE) ×3 IMPLANT
SYR CONTROL 10ML LL (SYRINGE) ×1 IMPLANT
TOWEL GREEN STERILE FF (TOWEL DISPOSABLE) ×1 IMPLANT
TOWEL OR 17X26 10 PK STRL BLUE (TOWEL DISPOSABLE) ×2 IMPLANT
WATER STERILE IRR 500ML POUR (IV SOLUTION) ×1 IMPLANT

## 2023-03-21 NOTE — Anesthesia Procedure Notes (Signed)
Procedure Name: Intubation Date/Time: 03/21/2023 7:54 AM  Performed by: Sampson Goon, CRNAPre-anesthesia Checklist: Patient identified, Emergency Drugs available, Suction available and Patient being monitored Patient Re-evaluated:Patient Re-evaluated prior to induction Oxygen Delivery Method: Circle System Utilized Preoxygenation: Pre-oxygenation with 100% oxygen Induction Type: IV induction Ventilation: Mask ventilation without difficulty Laryngoscope Size: Mac and 3 Grade View: Grade III Tube type: Oral Tube size: 7.5 mm Number of attempts: 1 Airway Equipment and Method: Stylet and Oral airway Placement Confirmation: ETT inserted through vocal cords under direct vision, positive ETCO2 and breath sounds checked- equal and bilateral Secured at: 22 cm Tube secured with: Tape Dental Injury: Teeth and Oropharynx as per pre-operative assessment

## 2023-03-21 NOTE — Op Note (Signed)
PATIENT:  Steven Figueroa  PRE-OPERATIVE DIAGNOSIS:  Organic erectile dysfunction  POST-OPERATIVE DIAGNOSIS:  Same  PROCEDURE:   3 piece inflatable penile prosthesis (BS/AMS) Injection of pharmacoagent into penis  SURGEON:  Irine Seal MD  ASST: Cathren Harsh, MD  INDICATION: He has had long-standing organic erectile dysfunction and refractory to other modes of treatment. He has elected to proceed with prosthesis implantation.  ANESTHESIA:  General  EBL:  Minimal  Device: 3 piece coloplast titan: 125 cc reservoir, 28 cm cylinders and 0 cm rear-tip extenders on right and left sides  LOCAL MEDICATIONS USED:  None  SPECIMEN: None  DISPOSITION OF SPECIMEN:  N/A  Description of procedure: The patient was taken to the major operating room, placed on the table and administered general anesthesia in the supine position. His genitalia was then prepped with chlorhexidine x 2. He was draped in the usual sterile fashion, and I used [place] on the field. An official timeout was then performed.  A dorsal penile block was performed. A butterfly needle was then used to inject normal saline into the penis to give an artificial erection. There was some mild dorsal/left curvature. I then injected 10 cc of lidocaine/marcaine into the penis.   A 14 French coude catheter was then placed in the bladder and the bladder was drained and the catheter was plugged. A midline penoscrotal incision was then made and the dissection was carried down to the corpora and urethra. The lonestar retractor was positioned so as to have excellent exposure. 2-0 Vicryl sutures were then placed proximally in each corpus cavernosum to serve as stay sutures. An incision was then made in the corpus cavernosum first on the left-hand side with the bovie. Jen Mow were used to gently dilate the opening. I then dilated the corpus cavernosum with the a 12 Fr brooks dilator distally and proximally. Field goal post tests were performed and there  was no evidence of perforations or crossover. I then irrigated the corpus cavernosum with antibiotic solution and measured the distance proximally and distally from the stay suture and was found to be 11.5 and 16.5 cm, respectively.I then turned my attention to the contralateral corpus cavernosum and placed my stay sutures, made my corporotomy and dilated the corpus cavernosum in an identical fashion. This was measured and also was found to be 11 cm proximally and 17 distally. It was irrigated with anastomotic solution as was the scrotum. I then chose an 28 cm cylinder set with 0 cm rear-tip extenders and these were prepped while I prepared the site for reservoir placement.  I then digitally probed into the Left external inguinal ring. My finger was used to poke through the posterior wall of the ring. I used my finger to ensure I was in the appropriate space, and to clear room for the reservoir. I irrigated the space with anastomotic solution and then placed the reservoir in this location. I then filled the reservoir with 120 cc of sterile saline, and checked to confirm proper position. There was minimal backpressure with the reservoir max-filled.  Attention was redirected to the corporotomies where the cylinders were then placed by first fixing the suture to the distal aspect of the right cylinder to a straight needle. This was then loaded on the [facility] inserter and passed through the corporotomy and distally. I then advanced the straight needle with the [place]low inserter out through the glans and this was grasped with a hemostat and pulled through the glans and the suture was secured with a  hemostat. I then performed an identical maneuver on the contralateral side. After this was performed I irrigated both corpus cavernosum; there was no evidence of urethral perforation. I inserted the distal portion of the cylinder through the corporotomies and pulled this to the end of the corpora with the suture. The proximal  aspect with the rear-tip extender was then passed through the corporotomy and into the seated position on each side. I then connected reservoir tubing to a syringe filled with sterile saline and inflated the device. I noted a good straight erection with both cylinders equidistant under the glans and no buckling of the cylinders. I therefore deflated the device and closed the corporotomies with used my previously placed stay sutures.   I then grasped the scrotal skin in the midline with a babcock, and used a hemostat to dissect down to the dependent-most portion of the scrotum. The nasal speculum was inserted into this space, and facilitated placement of the pump. The cylinder was then connected to the pump after excising the excess tubing with appropriate shodded hemostats in place and then I used the supplied connectors to make the connection. I then again cycled the device with the pump and it cycled properly. I deflated the device and pumped it up about three quarters of the way to aid with hemostasis. I irrigated the wound one last time with antibiotic irrigation and then closed the deep scrotal tissue over the tubing and pump with running 3-0 monocryl suture. I placed a 10 Fr blake drain over the corporotomies. A second layer was then closed over this first layer with running 3- 0 monocryl, and running skin suture w/ 4-0 monocryl performed. Incision dressed with dermabond.  A mummy wrap was applied. The catheter was connected to closed system drainage, and drain connected to suction bulb and the patient was awakened and taken recovery room in stable and satisfactory condition. He tolerated the procedure well and there were no intraoperative complications. Needle sponge and instrument counts were correct at the end of the operation.

## 2023-03-21 NOTE — Discharge Instructions (Signed)
Please read the attached information regarding care of your Jackson-Pratt drain. Contact a health care provider if: You have any signs of infection around your drain area. You have a fever or chills. The amount of drainage that you have stops all of a sudden, or the drainage increases rather than decreases. Your tube falls out. Your active drain does not stay compressed after you empty it. The tube gets detached from the bulb or container.

## 2023-03-21 NOTE — ED Provider Notes (Signed)
[place] EMERGENCY DEPARTMENT AT [facility] Provider Note   CSN: [id number] Arrival date & time: 03/21/23  1747     History  Chief Complaint  Patient presents with   Post-op Problem         Steven Figueroa is a 62 y.o. male who is status post inflatable penile implant placed this morning.  He presents this evening for concern that he may have pulled the stitches out of his JP drain.  Patient states he is not in significant pain because he has pain medicine he is "doped up."  Patient has a JP drain in the right upper portion of his pubic region.  HPI     Home Medications Prior to Admission medications   Medication Sig Start Date End Date Taking? Authorizing Provider  acetaminophen (TYLENOL) 500 MG tablet Take 2 tablets (1,000 mg total) by mouth every 6 (six) hours. 03/21/23   Despina Arias, MD  amLODipine (NORVASC) 10 MG tablet Take 10 mg by mouth daily. 11/08/19   [provider]  aspirin 81 MG EC tablet Take 81 mg by mouth daily. 04/30/20   [provider]  azaTHIOprine (IMURAN) 50 MG tablet Take 50 mg by mouth in the morning and at bedtime. 05/06/22   [provider]  azelastine (ASTELIN) 0.1 % nasal spray Place 1 spray into both nostrils 2 (two) times daily as needed for rhinitis. Use in each nostril as directed    [provider]  Brinzolamide-Brimonidine 1-0.2 % SUSP Place 1 drop into both eyes in the morning, at noon, and at bedtime. 12/12/19   [provider]  capsaicin (ZOSTRIX) 0.025 % cream Apply 1 Application topically 2 (two) times daily. 09/08/20   [provider]  celecoxib (CELEBREX) 200 MG capsule Take 1 capsule (200 mg total) by mouth 2 (two) times daily for 28 days. 03/21/23 04/18/23  Despina Arias, MD  cetirizine (ZYRTEC) 10 MG tablet Take 10 mg by mouth daily.    [provider]  Cholecalciferol 50 MCG (2000 UT) TABS Take 2,000 Units by mouth daily. 11/26/19   [provider]   EPINEPHrine 0.3 mg/0.3 mL IJ SOAJ injection Inject 0.3 mLs (0.3 mg total) into the muscle as needed for anaphylaxis. 03/11/20   Ellamae Sia, DO  ferrous sulfate 325 (65 FE) MG tablet Take 325 mg by mouth every other day. 05/07/20   [provider]  gabapentin (NEURONTIN) 300 MG capsule Take 300 mg by mouth 3 (three) times daily. 01/14/20   [provider]  glucose blood test strip  09/17/20   [provider]  insulin aspart (NOVOLOG) 100 UNIT/ML injection Inject 8 Units into the skin 3 (three) times daily before meals.    [provider]  insulin glargine (LANTUS) 100 UNIT/ML injection Inject 22 Units into the skin in the morning. 08/31/20   [provider]  ipratropium (ATROVENT) 0.03 % nasal spray Place 1-2 sprays into both nostrils 2 (two) times daily as needed (nasal drainage). Patient not taking: Reported on 02/28/2023 08/03/22   Ellamae Sia, DO  latanoprost (XALATAN) 0.005 % ophthalmic solution Place 1 drop into both eyes at bedtime. 05/11/20   [provider]  losartan (COZAAR) 100 MG tablet Take 100 mg by mouth daily. 01/12/20   [provider]  Magnesium Oxide 420 MG TABS Take 420 mg by mouth in the morning and at bedtime. 04/30/20   [provider]  methazolamide (NEPTAZANE) 50 MG tablet Take 50  mg by mouth 2 (two) times daily. 06/18/20   [provider]  modafinil (PROVIGIL) 200 MG tablet Take 200 mg by mouth daily. 09/02/20   [provider]  montelukast (SINGULAIR) 10 MG tablet Take 1 tablet (10 mg total) by mouth at bedtime. 08/03/22   Ellamae Sia, DO  Multiple Vitamin (MULTIVITAMIN WITH MINERALS) TABS tablet Take 1 tablet by mouth daily.    [provider]  Omega-3 Fatty Acids (FISH OIL) 1000 MG CAPS Take 1,000 mg by mouth daily. 11/26/19   [provider]  oxyCODONE (ROXICODONE) 5 MG immediate release tablet Take 1 tablet (5 mg total) by mouth every 6 (six) hours as needed. 03/21/23    Despina Arias, MD  pioglitazone (ACTOS) 45 MG tablet Take 45 mg by mouth daily. 11/26/19   [provider]  pravastatin (PRAVACHOL) 20 MG tablet Take 20 mg by mouth at bedtime. 11/26/19   [provider]  sulfamethoxazole-trimethoprim (BACTRIM DS) 800-160 MG tablet Take 1 tablet by mouth 2 (two) times daily. 03/21/23   Despina Arias, MD  timolol (TIMOPTIC) 0.5 % ophthalmic solution Place 1 drop into both eyes 2 (two) times daily. 05/26/20   [provider]      Allergies    Codeine, Iodine, and Morphine    Review of Systems   Review of Systems  Physical Exam Updated Vital Signs BP 109/69 (BP Location: Left Arm)   Pulse 94   Temp 98.4 F (36.9 C) (Oral)   Resp 20   Ht 6\' 1"  (1.854 m)   Wt 92 kg   SpO2 93%   BMI 26.76 kg/m  Physical Exam Vitals and nursing note reviewed.  Constitutional:      General: He is not in acute distress.    Appearance: He is well-developed. He is not diaphoretic.  HENT:     Head: Normocephalic and atraumatic.  Eyes:     General: No scleral icterus.    Conjunctiva/sclera: Conjunctivae normal.  Cardiovascular:     Rate and Rhythm: Normal rate and regular rhythm.     Heart sounds: Normal heart sounds.  Pulmonary:     Effort: Pulmonary effort is normal. No respiratory distress.     Breath sounds: Normal breath sounds.  Abdominal:     Palpations: Abdomen is soft.     Tenderness: There is no abdominal tenderness.  Genitourinary:    Comments: Significant swelling and bruising noted along the mons pubis there is a Jackson-Pratt drain in the right upper portion of the suprapubic area.  There is what appears to be dissolvable suture in place tethering the JP drains into the abdomen abdomen.  There is about less than 25 mL of bright red bloody drainage in the JP drain.  There is a small amount of oozing around the JP drain site.  I removed the bandaging from around the scrotum and penis.  The the penis is bandaged into an erect  position.  There is no significant bruising or swelling noted.  There is a small amount of Dermabond noted inferior to the penis on the scrotum without evidence of infection. Musculoskeletal:     Cervical back: Normal range of motion and neck supple.  Skin:    General: Skin is warm and dry.  Neurological:     Mental Status: He is alert.  Psychiatric:        Behavior: Behavior normal.     ED Results / Procedures / Treatments   Labs (all labs ordered are  listed, but only abnormal results are displayed) Labs Reviewed - No data to display  EKG None  Radiology No results found.  Procedures Procedures    Medications Ordered in ED Medications - No data to display  ED Course/ Medical Decision Making/ A&P                                 Medical Decision Making  I reviewed all findings with Dr. Cathren Harsh who is on-call for urology.  She states that this all sounds like normal postoperative findings and that he appears safe to follow-up in the clinic.  He has a clinic follow-up appointment on Friday should he have any worsening or concerns prior to then he can call the urology office.  Patient given information on how to care for his Jackson-Pratt drain and appears otherwise appropriate for discharge at this time        Final Clinical Impression(s) / ED Diagnoses Final diagnoses:  Visit for wound check    Rx / DC Orders ED Discharge Orders     None         Arthor Captain, PA-C 03/21/23 2112    Lorre Nick, MD 03/22/23 1421

## 2023-03-21 NOTE — ED Triage Notes (Signed)
Pt arrive POV.. Patient had surgery this morning, penile implant. States thinks something is wrong, increase bleeding from site. Has a JP drain in place, reports increase in drainage. Swelling noted to the areasutures intact

## 2023-03-21 NOTE — Discharge Instructions (Addendum)
Penile prosthesis postoperative instructions  Wound:  In most cases your incision will have absorbable sutures that will dissolve within the first 10-20 days. Some will fall out even earlier. Expect some redness as the sutures dissolved but this should occur only around the sutures. If there is generalized redness, especially with increasing pain or swelling, let us know. The scrotum and penis will very likely get "black and blue" as the blood in the tissues spread. Sometimes the whole scrotum will turn colors. The black and blue is followed by a yellow and brown color. In time, all the discoloration will go away. In some cases some firm swelling in the area of the testicle and pump may persist for up to 4-6 weeks after the surgery and is considered normal in most cases.  Drain:   You may be discharged home with a drain in place. If so, you will be taught how to empty it and should keep track of the output. Additionally, you should call the office to arrange for an appointment to have it removed after a few days.   Diet:  You may return to your normal diet within 24 hours following your surgery. You may note some mild nausea and possibly vomiting the first 6-8 hours following surgery. This is usually due to the side effects of anesthesia, and will disappear quite soon. I would suggest clear liquids and a very light meal the first evening following your surgery.  Activity:  Your physical activity should be restricted the first 48 hours. During that time you should remain relatively inactive, moving about only when necessary. During the first 3 weeks following surgery you should avoid lifting any heavy objects (anything greater than 15 pounds), and avoid strenuous exercise. If you work, ask us specifically about your restrictions, both for work and home. We will write a note to your employer if needed.  Avoid using your penis until your follow up visit with Dr Machen, which will typically be around  3-4 weeks following the surgery. Most people are able to start cycling their device after that appointment, and can have intercourse soon thereafter.   You should plan to wear a tight pair of jockey shorts or an athletic supporter for the first 4-5 days, even to sleep. This will keep the scrotum immobilized to some degree and keep the swelling down.The position of your penis will determine what is most comfortable but I strongly urge you to keep the penis in the "up" position (toward your head). You should continue to tuck "up" your penis when possible for the first 3 months following surgery.  Ice packs should be placed on and off over the scrotum for the first 48 hours. Frozen peas or corn in a ZipLock bag can be frozen, used and re-frozen. Fifteen minutes on and 15 minutes off is a reasonable schedule. The ice is a good pain reliever and keeps the swelling down.  Hygiene:  You may shower 48 hours after your surgery. Tub bathing should be restricted until the wound is completely healed, typically around 2-3 weeks.  Medication:  You will be sent home with some type of pain medication. In many cases you will be sent home with a strong anti-inflammatory medication (Celebrex, Meloxicam) and a narcotic pain pill (hydrocodone or oxycodone). You can also supplement these medications with tylenol (acetaminophen). If the pain medication you are sent home with does not control the pain, please notify the office Problems you should report to us:  Fever of 101.0 degrees   Fahrenheit or greater. Moderate or severe swelling under the skin incision or involving the scrotum. Drug reaction such as hives, a rash, nausea or vomiting.  

## 2023-03-21 NOTE — ED Provider Notes (Incomplete)
[place] EMERGENCY DEPARTMENT AT [facility] Provider Note   CSN: [id number] Arrival date & time: 03/21/23  1747     History {Add pertinent medical, surgical, social history, OB history to HPI:1} Chief Complaint  Patient presents with   Post-op Problem         Steven Figueroa is a 61 y.o. male.  HPI     Home Medications Prior to Admission medications   Medication Sig Start Date End Date Taking? Authorizing Provider  acetaminophen (TYLENOL) 500 MG tablet Take 2 tablets (1,000 mg total) by mouth every 6 (six) hours. 03/21/23   Despina Arias, MD  amLODipine (NORVASC) 10 MG tablet Take 10 mg by mouth daily. 11/08/19   [provider]  aspirin 81 MG EC tablet Take 81 mg by mouth daily. 04/30/20   [provider]  azaTHIOprine (IMURAN) 50 MG tablet Take 50 mg by mouth in the morning and at bedtime. 05/06/22   [provider]  azelastine (ASTELIN) 0.1 % nasal spray Place 1 spray into both nostrils 2 (two) times daily as needed for rhinitis. Use in each nostril as directed    [provider]  Brinzolamide-Brimonidine 1-0.2 % SUSP Place 1 drop into both eyes in the morning, at noon, and at bedtime. 12/12/19   [provider]  capsaicin (ZOSTRIX) 0.025 % cream Apply 1 Application topically 2 (two) times daily. 09/08/20   [provider]  celecoxib (CELEBREX) 200 MG capsule Take 1 capsule (200 mg total) by mouth 2 (two) times daily for 28 days. 03/21/23 04/18/23  Despina Arias, MD  cetirizine (ZYRTEC) 10 MG tablet Take 10 mg by mouth daily.    [provider]  Cholecalciferol 50 MCG (2000 UT) TABS Take 2,000 Units by mouth daily. 11/26/19   [provider]  EPINEPHrine 0.3 mg/0.3 mL IJ SOAJ injection Inject 0.3 mLs (0.3 mg total) into the muscle as needed for anaphylaxis. 03/11/20   Ellamae Sia, DO  ferrous sulfate 325 (65 FE) MG tablet Take 325 mg by mouth every other day. 05/07/20   [provider]   gabapentin (NEURONTIN) 300 MG capsule Take 300 mg by mouth 3 (three) times daily. 01/14/20   [provider]  glucose blood test strip  09/17/20   [provider]  insulin aspart (NOVOLOG) 100 UNIT/ML injection Inject 8 Units into the skin 3 (three) times daily before meals.    [provider]  insulin glargine (LANTUS) 100 UNIT/ML injection Inject 22 Units into the skin in the morning. 08/31/20   [provider]  ipratropium (ATROVENT) 0.03 % nasal spray Place 1-2 sprays into both nostrils 2 (two) times daily as needed (nasal drainage). Patient not taking: Reported on 02/28/2023 08/03/22   Ellamae Sia, DO  latanoprost (XALATAN) 0.005 % ophthalmic solution Place 1 drop into both eyes at bedtime. 05/11/20   [provider]  losartan (COZAAR) 100 MG tablet Take 100 mg by mouth daily. 01/12/20   [provider]  Magnesium Oxide 420 MG TABS Take 420 mg by mouth in the morning and at bedtime. 04/30/20   [provider]  methazolamide (NEPTAZANE) 50 MG tablet Take 50 mg by mouth 2 (two) times daily. 06/18/20   [provider]  modafinil (PROVIGIL) 200 MG tablet Take 200 mg by mouth daily. 09/02/20   [provider]  montelukast (SINGULAIR) 10 MG tablet Take 1 tablet (10 mg total) by mouth at bedtime. 08/03/22   Ellamae Sia,  DO  Multiple Vitamin (MULTIVITAMIN WITH MINERALS) TABS tablet Take 1 tablet by mouth daily.    [provider]  Omega-3 Fatty Acids (FISH OIL) 1000 MG CAPS Take 1,000 mg by mouth daily. 11/26/19   [provider]  oxyCODONE (ROXICODONE) 5 MG immediate release tablet Take 1 tablet (5 mg total) by mouth every 6 (six) hours as needed. 03/21/23   Despina Arias, MD  pioglitazone (ACTOS) 45 MG tablet Take 45 mg by mouth daily. 11/26/19   [provider]  pravastatin (PRAVACHOL) 20 MG tablet Take 20 mg by mouth at bedtime. 11/26/19   [provider]  sulfamethoxazole-trimethoprim  (BACTRIM DS) 800-160 MG tablet Take 1 tablet by mouth 2 (two) times daily. 03/21/23   Despina Arias, MD  timolol (TIMOPTIC) 0.5 % ophthalmic solution Place 1 drop into both eyes 2 (two) times daily. 05/26/20   [provider]      Allergies    Codeine, Iodine, and Morphine    Review of Systems   Review of Systems  Physical Exam Updated Vital Signs BP 109/69 (BP Location: Left Arm)   Pulse 94   Temp 98.4 F (36.9 C) (Oral)   Resp 20   Ht 6\' 1"  (1.854 m)   Wt 92 kg   SpO2 93%   BMI 26.76 kg/m  Physical Exam  ED Results / Procedures / Treatments   Labs (all labs ordered are listed, but only abnormal results are displayed) Labs Reviewed - No data to display  EKG None  Radiology No results found.  Procedures Procedures  {Document cardiac monitor, telemetry assessment procedure when appropriate:1}  Medications Ordered in ED Medications - No data to display  ED Course/ Medical Decision Making/ A&P   {   Click here for ABCD2, HEART and other calculatorsREFRESH Note before signing :1}                              Medical Decision Making  ***  {Document critical care time when appropriate:1} {Document review of labs and clinical decision tools ie heart score, Chads2Vasc2 etc:1}  {Document your independent review of radiology images, and any outside records:1} {Document your discussion with family members, caretakers, and with consultants:1} {Document social determinants of health affecting pt's care:1} {Document your decision making why or why not admission, treatments were needed:1} Final Clinical Impression(s) / ED Diagnoses Final diagnoses:  None    Rx / DC Orders ED Discharge Orders     None

## 2023-03-21 NOTE — Transfer of Care (Signed)
Immediate Anesthesia Transfer of Care Note  Patient: Steven Figueroa  Procedure(s) Performed: INSERTION OF INFLATABLE PENILE PROSTHESIS (Penis)  Patient Location: PACU  Anesthesia Type:General  Level of Consciousness: drowsy  Airway & Oxygen Therapy: Patient Spontanous Breathing and Patient connected to nasal cannula oxygen  Post-op Assessment: Report given to RN, Post -op Vital signs reviewed and stable, and Patient moving all extremities X 4  Post vital signs: Reviewed and stable  Last Vitals:  Vitals Value Taken Time  BP 141/94 03/21/23 1006  Temp    Pulse 80 03/21/23 1011  Resp 14 03/21/23 1011  SpO2 90 % 03/21/23 1011  Vitals shown include unfiled device data.  Last Pain:  Vitals:   03/21/23 0635  TempSrc:   PainSc: 0-No pain      Patients Stated Pain Goal: 4 (03/21/23 1610)  Complications: No notable events documented.

## 2023-03-21 NOTE — Anesthesia Preprocedure Evaluation (Signed)
Anesthesia Evaluation  Patient identified by MRN, date of birth, ID band Patient awake    Reviewed: Allergy & Precautions, NPO status , Patient's Chart, lab work & pertinent test results  Airway Mallampati: II  TM Distance: >3 FB Neck ROM: Full    Dental  (+) Dental Advisory Given, Partial Upper, Partial Lower   Pulmonary former smoker   Pulmonary exam normal breath sounds clear to auscultation       Cardiovascular hypertension, Pt. on medications Normal cardiovascular exam Rhythm:Regular Rate:Normal     Neuro/Psych negative neurological ROS     GI/Hepatic negative GI ROS, Neg liver ROS,,,  Endo/Other  diabetes, Type 2, Insulin Dependent    Renal/GU negative Renal ROS     Musculoskeletal  (+) Arthritis ,    Abdominal   Peds  Hematology negative hematology ROS (+)   Anesthesia Other Findings Day of surgery medications reviewed with the patient.  Reproductive/Obstetrics ERECTILE DYSFUNCTION                             Anesthesia Physical Anesthesia Plan  ASA: 2  Anesthesia Plan: General   Post-op Pain Management: Tylenol PO (pre-op)*   Induction: Intravenous  PONV Risk Score and Plan: 2 and Midazolam, Dexamethasone and Ondansetron  Airway Management Planned: Oral ETT  Additional Equipment:   Intra-op Plan:   Post-operative Plan: Extubation in OR  Informed Consent: I have reviewed the patients History and Physical, chart, labs and discussed the procedure including the risks, benefits and alternatives for the proposed anesthesia with the patient or authorized representative who has indicated his/her understanding and acceptance.     Dental advisory given  Plan Discussed with: CRNA  Anesthesia Plan Comments:        Anesthesia Quick Evaluation

## 2023-03-21 NOTE — H&P (Signed)
H&P  History of Present Illness: Steven Figueroa is a 62 y.o. year old M who presents today for insertion of an inflatable penile prosthesis  No acute complaints  Past Medical History:  Diagnosis Date   Anemia    Arthritis    Diabetes mellitus without complication (HCC)    Hypertension    MRSA infection    Foot    Past Surgical History:  Procedure Laterality Date   BUNIONECTOMY     EYE SURGERY     UMBILICAL HERNIA REPAIR      Home Medications:  Current Meds  Medication Sig   amLODipine (NORVASC) 10 MG tablet Take 10 mg by mouth daily.   aspirin 81 MG EC tablet Take 81 mg by mouth daily.   azaTHIOprine (IMURAN) 50 MG tablet Take 50 mg by mouth in the morning and at bedtime.   azelastine (ASTELIN) 0.1 % nasal spray Place 1 spray into both nostrils 2 (two) times daily as needed for rhinitis. Use in each nostril as directed   Brinzolamide-Brimonidine 1-0.2 % SUSP Place 1 drop into both eyes in the morning, at noon, and at bedtime.   capsaicin (ZOSTRIX) 0.025 % cream Apply 1 Application topically 2 (two) times daily.   cetirizine (ZYRTEC) 10 MG tablet Take 10 mg by mouth daily.   Cholecalciferol 50 MCG (2000 UT) TABS Take 2,000 Units by mouth daily.   ferrous sulfate 325 (65 FE) MG tablet Take 325 mg by mouth every other day.   gabapentin (NEURONTIN) 300 MG capsule Take 300 mg by mouth 3 (three) times daily.   insulin aspart (NOVOLOG) 100 UNIT/ML injection Inject 8 Units into the skin 3 (three) times daily before meals.   insulin glargine (LANTUS) 100 UNIT/ML injection Inject 22 Units into the skin in the morning.   latanoprost (XALATAN) 0.005 % ophthalmic solution Place 1 drop into both eyes at bedtime.   losartan (COZAAR) 100 MG tablet Take 100 mg by mouth daily.   Magnesium Oxide 420 MG TABS Take 420 mg by mouth in the morning and at bedtime.   methazolamide (NEPTAZANE) 50 MG tablet Take 50 mg by mouth 2 (two) times daily.   modafinil (PROVIGIL) 200 MG tablet Take 200 mg by  mouth daily.   montelukast (SINGULAIR) 10 MG tablet Take 1 tablet (10 mg total) by mouth at bedtime.   Multiple Vitamin (MULTIVITAMIN WITH MINERALS) TABS tablet Take 1 tablet by mouth daily.   Omega-3 Fatty Acids (FISH OIL) 1000 MG CAPS Take 1,000 mg by mouth daily.   pioglitazone (ACTOS) 45 MG tablet Take 45 mg by mouth daily.   pravastatin (PRAVACHOL) 20 MG tablet Take 20 mg by mouth at bedtime.   timolol (TIMOPTIC) 0.5 % ophthalmic solution Place 1 drop into both eyes 2 (two) times daily.    Allergies:  Allergies  Allergen Reactions   Codeine Itching   Iodine     Pt states he's no longer allergic to iodine   Morphine Itching    History reviewed. No pertinent family history.  Social History:  reports that he has quit smoking. His smoking use included cigarettes. He has never used smokeless tobacco. He reports that he does not currently use alcohol. He reports that he does not use drugs.  ROS: A complete review of systems was performed.  All systems are negative except for pertinent findings as noted.  Physical Exam:  Vital signs in last 24 hours: Temp:  [98.4 F (36.9 C)] 98.4 F (36.9 C) (09/03 0556) Pulse  Rate:  [77] 77 (09/03 0556) Resp:  [18] 18 (09/03 0556) BP: (144)/(70) 144/70 (09/03 0556) SpO2:  [98 %] 98 % (09/03 0556) Weight:  [92.5 kg] 92.5 kg (09/03 1610) Constitutional:  Alert and oriented, No acute distress Cardiovascular: Regular rate and rhythm Respiratory: Normal respiratory effort, Lungs clear bilaterally GI: Abdomen is soft, nontender, nondistended, no abdominal masses Lymphatic: No lymphadenopathy Neurologic: Grossly intact, no focal deficits Psychiatric: Normal mood and affect   Laboratory Data:  No results for input(s): "WBC", "HGB", "HCT", "PLT" in the last 72 hours.  No results for input(s): "NA", "K", "CL", "GLUCOSE", "BUN", "CALCIUM", "CREATININE" in the last 72 hours.  Invalid input(s): "CO3"   Results for orders placed or performed  during the hospital encounter of 03/21/23 (from the past 24 hour(s))  Glucose, capillary     Status: Abnormal   Collection Time: 03/21/23  5:55 AM  Result Value Ref Range   Glucose-Capillary 144 (H) 70 - 99 mg/dL   No results found for this or any previous visit (from the past 240 hour(s)).  Renal Function: No results for input(s): "CREATININE" in the last 168 hours. Estimated Creatinine Clearance: 68.4 mL/min (A) (by C-G formula based on SCr of 1.25 mg/dL (H)).  Radiologic Imaging: No results found.  Assessment:  Steven Figueroa is a 62 y.o. 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 Procedure and risks reviewed, including but not limited to bleeding, infection, implant infection, implant malfunction, implant malplacement, migration, erosion, damage to adjacent structures, pain, urinary retention. All questions answered   Irine Seal, MD 03/21/2023, 7:18 AM  [facility] Pager: [phone]

## 2023-03-21 NOTE — Progress Notes (Signed)
Reviewed discharge instructions with patient and spouse Steven Figueroa, including JP drain emptying and charting. Both verbalize understanding and have no further questions.

## 2023-03-22 NOTE — Anesthesia Postprocedure Evaluation (Signed)
Anesthesia Post Note  Patient: Steven Figueroa  Procedure(s) Performed: INSERTION OF INFLATABLE PENILE PROSTHESIS (Penis)     Patient location during evaluation: PACU Anesthesia Type: General Level of consciousness: awake and alert Pain management: pain level controlled Vital Signs Assessment: post-procedure vital signs reviewed and stable Respiratory status: spontaneous breathing, nonlabored ventilation, respiratory function stable and patient connected to nasal cannula oxygen Cardiovascular status: blood pressure returned to baseline and stable Postop Assessment: no apparent nausea or vomiting Anesthetic complications: no   No notable events documented.  Last Vitals:  Vitals:   03/21/23 1045 03/21/23 1105  BP: (!) 131/96 131/89  Pulse: 76 69  Resp: 14 17  Temp:  36.6 C  SpO2: 95% 94%    Last Pain:  Vitals:   03/21/23 1105  TempSrc:   PainSc: 2                  Collene Schlichter

## 2023-03-24 ENCOUNTER — Encounter (HOSPITAL_COMMUNITY): Payer: Self-pay | Admitting: Urology

## 2023-03-28 NOTE — Progress Notes (Unsigned)
Follow Up Note  RE: Steven Figueroa MRN: [id number] DOB: 27-Jul-1960 Date of Office Visit: 03/29/2023  Referring provider: [facility] Primary care provider: [facility]  Chief Complaint: No chief complaint on file.  History of Present Illness: I had the pleasure of seeing Steven Figueroa for a follow up visit at the [facility] on 03/28/2023. He is a 62 y.o. male, who is being followed for allergic rhinoconjunctivitis on AIT. His previous allergy office visit was on 08/03/2022 with Dr. Selena Batten. Today is a regular follow up visit.  Seasonal and perennial allergic rhinoconjunctivitis Past history - Perennial rhinoconjunctivitis symptoms for many years but worse the last year and during the spring.  No previous ENT evaluation.  Patient follows with ophthalmology for glaucoma injections. 2021 skin testing showed positive to grass, weed, ragweed, trees, mold, dust mites, dog. Started AIT on 04/01/2020 (G-W-RW-T, M-DM-D). Interim history -  Some improvement in symptoms with AIT. Takes Singulair daily and ipratropium nasal spray.  Continue environmental control measures as below. Continue Singulair (montelukast) 10mg  daily at night. Use Atrovent (ipratropium) 0.03% 1-2 sprays per nostril twice a day as needed for runny nose/drainage. Nasal saline spray (i.e., Simply Saline) or nasal saline lavage (i.e., NeilMed) is recommended as needed and prior to medicated nasal sprays. Continue allergy injections - given today.  No steroid nasal sprays due to glaucoma issues.   Return in about 1 year (around 08/04/2023).  Assessment and Plan: Steven Figueroa is a 62 y.o. male with: Seasonal allergic rhinitis due to pollen Allergic rhinitis due to animal dander Allergic rhinitis due to dust mite Allergic rhinitis due to mold Allergic conjunctivitis of both eyes Past history - Patient follows with ophthalmology for glaucoma injections. 2021 skin testing positive to grass, weed, ragweed,  trees, mold, dust mites, dog. Started AIT on 04/01/2020 (G-W-RW-T, M-DM-D). Interim history -    No follow-ups on file.  No orders of the defined types were placed in this encounter.  Lab Orders  No laboratory test(s) ordered today    Diagnostics: Spirometry:  Tracings reviewed. His effort: {Blank single:19197::"Good reproducible efforts.","It was hard to get consistent efforts and there is a question as to whether this reflects a maximal maneuver.","Poor effort, data can not be interpreted."} FVC: ***L FEV1: ***L, ***% predicted FEV1/FVC ratio: ***% Interpretation: {Blank single:19197::"Spirometry consistent with mild obstructive disease","Spirometry consistent with moderate obstructive disease","Spirometry consistent with severe obstructive disease","Spirometry consistent with possible restrictive disease","Spirometry consistent with mixed obstructive and restrictive disease","Spirometry uninterpretable due to technique","Spirometry consistent with normal pattern","No overt abnormalities noted given today's efforts"}.  Please see scanned spirometry results for details.  Skin Testing: {Blank single:19197::"Select foods","Environmental allergy panel","Environmental allergy panel and select foods","Food allergy panel","None","Deferred due to recent antihistamines use"}. *** Results discussed with patient/family.   Medication List:  Current Outpatient Medications  Medication Sig Dispense Refill   acetaminophen (TYLENOL) 500 MG tablet Take 2 tablets (1,000 mg total) by mouth every 6 (six) hours. 60 tablet 0   amLODipine (NORVASC) 10 MG tablet Take 10 mg by mouth daily.     aspirin 81 MG EC tablet Take 81 mg by mouth daily.     azaTHIOprine (IMURAN) 50 MG tablet Take 50 mg by mouth in the morning and at bedtime.     azelastine (ASTELIN) 0.1 % nasal spray Place 1 spray into both nostrils 2 (two) times daily as needed for rhinitis. Use in each nostril as directed      Brinzolamide-Brimonidine 1-0.2 % SUSP Place 1 drop into  both eyes in the morning, at noon, and at bedtime.     capsaicin (ZOSTRIX) 0.025 % cream Apply 1 Application topically 2 (two) times daily.     celecoxib (CELEBREX) 200 MG capsule Take 1 capsule (200 mg total) by mouth 2 (two) times daily for 28 days. 28 capsule 1   cetirizine (ZYRTEC) 10 MG tablet Take 10 mg by mouth daily.     Cholecalciferol 50 MCG (2000 UT) TABS Take 2,000 Units by mouth daily.     EPINEPHrine 0.3 mg/0.3 mL IJ SOAJ injection Inject 0.3 mLs (0.3 mg total) into the muscle as needed for anaphylaxis. 1 each 2   ferrous sulfate 325 (65 FE) MG tablet Take 325 mg by mouth every other day.     gabapentin (NEURONTIN) 300 MG capsule Take 300 mg by mouth 3 (three) times daily.     glucose blood test strip      insulin aspart (NOVOLOG) 100 UNIT/ML injection Inject 8 Units into the skin 3 (three) times daily before meals.     insulin glargine (LANTUS) 100 UNIT/ML injection Inject 22 Units into the skin in the morning.     ipratropium (ATROVENT) 0.03 % nasal spray Place 1-2 sprays into both nostrils 2 (two) times daily as needed (nasal drainage). (Patient not taking: Reported on 02/28/2023) 90 mL 3   latanoprost (XALATAN) 0.005 % ophthalmic solution Place 1 drop into both eyes at bedtime.     losartan (COZAAR) 100 MG tablet Take 100 mg by mouth daily.     Magnesium Oxide 420 MG TABS Take 420 mg by mouth in the morning and at bedtime.     methazolamide (NEPTAZANE) 50 MG tablet Take 50 mg by mouth 2 (two) times daily.     modafinil (PROVIGIL) 200 MG tablet Take 200 mg by mouth daily.     montelukast (SINGULAIR) 10 MG tablet Take 1 tablet (10 mg total) by mouth at bedtime. 90 tablet 3   Multiple Vitamin (MULTIVITAMIN WITH MINERALS) TABS tablet Take 1 tablet by mouth daily.     Omega-3 Fatty Acids (FISH OIL) 1000 MG CAPS Take 1,000 mg by mouth daily.     oxyCODONE (ROXICODONE) 5 MG immediate release tablet Take 1 tablet (5 mg total) by  mouth every 6 (six) hours as needed. 20 tablet 0   pioglitazone (ACTOS) 45 MG tablet Take 45 mg by mouth daily.     pravastatin (PRAVACHOL) 20 MG tablet Take 20 mg by mouth at bedtime.     sulfamethoxazole-trimethoprim (BACTRIM DS) 800-160 MG tablet Take 1 tablet by mouth 2 (two) times daily. 14 tablet 0   timolol (TIMOPTIC) 0.5 % ophthalmic solution Place 1 drop into both eyes 2 (two) times daily.     No current facility-administered medications for this visit.   Allergies: Allergies  Allergen Reactions   Codeine Itching   Iodine     Pt states he's no longer allergic to iodine   Morphine Itching   I reviewed his past medical history, social history, family history, and environmental history and no significant changes have been reported from his previous visit.  Review of Systems  Constitutional:  Negative for appetite change, chills, fever and unexpected weight change.  HENT:  Negative for congestion, nosebleeds, postnasal drip and rhinorrhea.   Respiratory:  Negative for cough, chest tightness, shortness of breath and wheezing.   Cardiovascular:  Negative for chest pain.  Gastrointestinal:  Negative for abdominal pain.  Genitourinary:  Negative for difficulty urinating.  Skin:  Negative  for rash.  Allergic/Immunologic: Positive for environmental allergies.  Neurological:  Negative for headaches.    Objective: There were no vitals taken for this visit. There is no height or weight on file to calculate BMI. Physical Exam Vitals and nursing note reviewed.  Constitutional:      Appearance: Normal appearance. He is well-developed.  HENT:     Head: Normocephalic and atraumatic.     Right Ear: Tympanic membrane and external ear normal.     Left Ear: Tympanic membrane and external ear normal.     Nose: Nose normal.     Mouth/Throat:     Mouth: Mucous membranes are moist.     Pharynx: Oropharynx is clear.  Eyes:     Conjunctiva/sclera: Conjunctivae normal.  Cardiovascular:      Rate and Rhythm: Normal rate and regular rhythm.     Heart sounds: Normal heart sounds. No murmur heard.    No friction rub. No gallop.  Pulmonary:     Effort: Pulmonary effort is normal.     Breath sounds: Normal breath sounds. No wheezing, rhonchi or rales.  Musculoskeletal:     Cervical back: Neck supple.  Skin:    General: Skin is warm.     Findings: No rash.  Neurological:     Mental Status: He is alert and oriented to person, place, and time.  Psychiatric:        Behavior: Behavior normal.    Previous notes and tests were reviewed. The plan was reviewed with the patient/family, and all questions/concerned were addressed.  It was my pleasure to see Steven Figueroa today and participate in his care. Please feel free to contact me with any questions or concerns.  Sincerely,  Wyline Mood, DO Allergy & Immunology  [facility] office: [phone] [facility] office: [phone]

## 2023-03-29 ENCOUNTER — Ambulatory Visit (INDEPENDENT_AMBULATORY_CARE_PROVIDER_SITE_OTHER): Payer: No Typology Code available for payment source | Admitting: Allergy

## 2023-03-29 ENCOUNTER — Encounter: Payer: Self-pay | Admitting: Allergy

## 2023-03-29 ENCOUNTER — Other Ambulatory Visit: Payer: Self-pay

## 2023-03-29 VITALS — BP 130/78 | HR 80 | Temp 98.0°F | Wt 208.0 lb

## 2023-03-29 DIAGNOSIS — H1013 Acute atopic conjunctivitis, bilateral: Secondary | ICD-10-CM | POA: Diagnosis not present

## 2023-03-29 DIAGNOSIS — J301 Allergic rhinitis due to pollen: Secondary | ICD-10-CM | POA: Diagnosis not present

## 2023-03-29 DIAGNOSIS — J3089 Other allergic rhinitis: Secondary | ICD-10-CM

## 2023-03-29 DIAGNOSIS — J3081 Allergic rhinitis due to animal (cat) (dog) hair and dander: Secondary | ICD-10-CM | POA: Diagnosis not present

## 2023-03-29 MED ORDER — CETIRIZINE HCL 10 MG PO TABS: 10.0000 mg | ORAL_TABLET | Freq: Every morning | ORAL | 3 refills | Status: DC

## 2023-03-29 MED ORDER — MONTELUKAST SODIUM 10 MG PO TABS: 10.0000 mg | ORAL_TABLET | Freq: Every day | ORAL | 3 refills | Status: DC

## 2023-03-29 NOTE — Patient Instructions (Addendum)
Environmental allergies Past skin testing showed: Positive to grass, weed, ragweed, trees, mold, dust mites, dog. Continue environmental control measures. Continue Singulair (montelukast) 10mg  daily at night.  After Saturday stop nasal sprays and zyrtec. Continue zyrtec (cetirizine) 10mg  daily at night.  May use nasal saline spray (i.e., Simply Saline) or nasal saline lavage (i.e., NeilMed) is recommended as needed.   Follow up next Wednesday at 8:30AM for skin testing. Bring your nasal sprays and allergy medications you are taking.

## 2023-04-04 NOTE — Progress Notes (Unsigned)
Skin testing note  RE: Steven Figueroa MRN: [id number] DOB: [birth year] Date of Office Visit: 04/05/2023  Referring provider: [facility] Primary care provider: [facility]  Chief Complaint: skin testing.  History of Present Illness: I had the pleasure of seeing Steven Figueroa for a skin testing visit at the [facility] on 04/05/2023. He is a 62 y.o. male, who is being followed for allergic rhinitis. His previous allergy office visit was on 03/29/2023 with Dr. Selena Batten. Today is a skin testing visit.   Environmental allergies Currently on azelastine 2 sprays per nostril BID, zyrtec, and Singulair daily.  Wants to restart injections.   Assessment and Plan: [name] is a 62 y.o. male with: Other allergic rhinitis Seasonal allergic rhinitis due to pollen Allergic rhinitis due to dust mite Allergic rhinitis due to mold Past history - Patient follows with ophthalmology for glaucoma injections. 2021 skin testing positive to grass, weed, ragweed, trees, mold, dust mites, dog. Started AIT on 04/01/2020 (G-W-RW-T, M-DM-D) and stopped in Feb 2024 with worsening symptoms now.  Interim history -  Today's skin testing positive to grass, ragweed, weed, trees, mold and dust mites.  Start environmental control measures as below. Use over the counter antihistamines such as Zyrtec (cetirizine), Claritin (loratadine), Allegra (fexofenadine), or Xyzal (levocetirizine) daily as needed. May take twice a day during allergy flares. May switch antihistamines every few months. Continue Singulair (montelukast) 10mg  daily at night. Use azelastine nasal spray 1-2 sprays per nostril twice a day as needed for runny nose/drainage. Restart allergy injections - 2 injections.   Return in about 6 months (around 10/03/2023).  Meds ordered this encounter  Medications   cetirizine (ZYRTEC ALLERGY) 10 MG tablet    Sig: Take 1 tablet (10 mg total) by mouth in the morning.    Dispense:  90 tablet     Refill:  3   montelukast (SINGULAIR) 10 MG tablet    Sig: Take 1 tablet (10 mg total) by mouth at bedtime.    Dispense:  90 tablet    Refill:  3   azelastine (ASTELIN) 0.1 % nasal spray    Sig: Place 1 spray into both nostrils 2 (two) times daily as needed for rhinitis. Use in each nostril as directed    Dispense:  90 mL    Refill:  3   EPINEPHrine 0.3 mg/0.3 mL IJ SOAJ injection    Sig: Inject 0.3 mg into the muscle as needed for anaphylaxis.    Dispense:  2 each    Refill:  1    May dispense generic/Mylan/Teva brand.   Lab Orders  No laboratory test(s) ordered today    Diagnostics: Skin Testing: Environmental allergy panel. Positive to grass, ragweed, weed, trees, mold and dust mites.  Results discussed with patient/family.  Airborne Adult Perc - 04/05/23 0926     Time Antigen Placed 2440    Allergen Manufacturer Waynette Buttery    Location Back    Number of Test 55    1. Control-Buffer 50% Glycerol Negative    2. Control-Histamine 2+    3. Bahia 2+    4. [place] 2+    5. Johnson 2+    6. [place] Blue Negative    7. Meadow Fescue 2+    8. Perennial Rye Negative    9. Timothy Negative    10. Ragweed Mix Negative    11. Cocklebur Negative    12. Plantain,  English 2+    13. Baccharis 2+  14. Dog Fennel 2+    15. Russian Thistle Negative    16. Lamb's Quarters Negative    17. Sheep Sorrell Negative    18. Rough Pigweed Negative    19. Marsh Elder, Rough Negative    20. Mugwort, Common Negative    21. Box, Elder Negative    22. Cedar, red 2+    23. Sweet Gum Negative    24. Pecan Pollen 3+    25. Pine Mix 2+    26. Walnut, Black Pollen 2+    27. Red Mulberry 3+    28. Ash Mix 2+    29. Birch Mix Negative    30. Beech American 2+    31. Cottonwood, [place] 2+    32. Hickory, White 2+    33. Maple Mix Negative    34. Oak, [place] Mix 2+    35. Sycamore Eastern Negative    36. Alternaria Alternata Negative    37. Cladosporium Herbarum Negative    38.  Aspergillus Mix Negative    39. Penicillium Mix Negative    40. Bipolaris Sorokiniana (Helminthosporium) Negative    41. Drechslera Spicifera (Curvularia) Negative    42. Mucor Plumbeus Negative    43. Fusarium Moniliforme Negative    44. Aureobasidium Pullulans (pullulara) Negative    45. Rhizopus Oryzae Negative    46. Botrytis Cinera Negative    47. Epicoccum Nigrum Negative    48. Phoma Betae Negative    49. Dust Mite Mix Negative    50. Cat Hair 10,000 BAU/ml Negative    51.  Dog Epithelia Negative    52. Mixed Feathers Negative    53. Horse Epithelia Negative    54. Cockroach, German Negative    55. Tobacco Leaf Negative             Intradermal - 04/05/23 0940     Time Antigen Placed 0940    Allergen Manufacturer Waynette Buttery    Location Arm    Number of Test 11    Intradermal Select    Control Negative    Ragweed Mix 3+    Weed Mix 2+    Mold 1 Negative    Mold 2 Negative    Mold 3 Negative    Mold 4 3+    Mite Mix 2+    Cat Negative    Dog Negative    Cockroach Negative             Previous notes and tests were reviewed. The plan was reviewed with the patient/family, and all questions/concerned were addressed.  It was my pleasure to see Steven Figueroa today and participate in his care. Please feel free to contact me with any questions or concerns.  Sincerely,  Wyline Mood, DO Allergy & Immunology  [facility] office: [phone] [facility] office: [phone]

## 2023-04-05 ENCOUNTER — Ambulatory Visit: Payer: No Typology Code available for payment source | Admitting: Allergy

## 2023-04-05 ENCOUNTER — Other Ambulatory Visit: Payer: Self-pay

## 2023-04-05 ENCOUNTER — Encounter: Payer: Self-pay | Admitting: Allergy

## 2023-04-05 VITALS — BP 138/80 | HR 90 | Temp 98.2°F | Resp 18

## 2023-04-05 DIAGNOSIS — J3089 Other allergic rhinitis: Secondary | ICD-10-CM | POA: Diagnosis not present

## 2023-04-05 DIAGNOSIS — J301 Allergic rhinitis due to pollen: Secondary | ICD-10-CM

## 2023-04-05 MED ORDER — MONTELUKAST SODIUM 10 MG PO TABS: 10.0000 mg | ORAL_TABLET | Freq: Every day | ORAL | 3 refills | Status: AC

## 2023-04-05 MED ORDER — AZELASTINE HCL 0.1 % NA SOLN: 1.0000 | Freq: Two times a day (BID) | NASAL | 3 refills | Status: AC | PRN

## 2023-04-05 MED ORDER — CETIRIZINE HCL 10 MG PO TABS: 10.0000 mg | ORAL_TABLET | Freq: Every morning | ORAL | 3 refills | Status: AC

## 2023-04-05 MED ORDER — EPINEPHRINE 0.3 MG/0.3ML IJ SOAJ: 0.3000 mg | INTRAMUSCULAR | 1 refills | Status: DC | PRN

## 2023-04-05 NOTE — Patient Instructions (Addendum)
Today's skin testing:  Positive to grass, ragweed, weed, trees, mold and dust mites.   Results given.  Environmental allergies Start environmental control measures as below. Use over the counter antihistamines such as Zyrtec (cetirizine), Claritin (loratadine), Allegra (fexofenadine), or Xyzal (levocetirizine) daily as needed. May take twice a day during allergy flares. May switch antihistamines every few months. Continue Singulair (montelukast) 10mg  daily at night. Use azelastine nasal spray 1-2 sprays per nostril twice a day as needed for runny nose/drainage.  Start allergy injections - 2 injections.  Had a detailed discussion with patient/family that clinical history is suggestive of allergic rhinitis, and may benefit from allergy immunotherapy (AIT). Discussed in detail regarding the dosing, schedule, side effects (mild to moderate local allergic reaction and rarely systemic allergic reactions including anaphylaxis), and benefits (significant improvement in nasal symptoms, seasonal flares of asthma) of immunotherapy with the patient. There is significant time commitment involved with allergy shots, which includes weekly immunotherapy injections for first 9-12 months and then biweekly to monthly injections for 3-5 years. Consent was signed. For mild symptoms you can take over the counter antihistamines such as Benadryl and monitor symptoms closely. If symptoms worsen or if you have severe symptoms including breathing issues, throat closure, significant swelling, whole body hives, severe diarrhea and vomiting, lightheadedness then inject epinephrine and seek immediate medical care afterwards. Emergency action plan given.  Return in about 6 months (around 10/03/2023). Or sooner if needed.  Make first allergy injection for 2-3 weeks.   Reducing Pollen Exposure Pollen seasons: trees (spring), grass (summer) and ragweed/weeds (fall). Keep windows closed in your home and car to lower pollen exposure.   Install air conditioning in the bedroom and throughout the house if possible.  Avoid going out in dry windy days - especially early morning. Pollen counts are highest between 5 - 10 AM and on dry, hot and windy days.  Save outside activities for late afternoon or after a heavy rain, when pollen levels are lower.  Avoid mowing of grass if you have grass pollen allergy. Be aware that pollen can also be transported indoors on people and pets.  Dry your clothes in an automatic dryer rather than hanging them outside where they might collect pollen.  Rinse hair and eyes before bedtime.  Control of House Dust Mite Allergen Dust mite allergens are a common trigger of allergy and asthma symptoms. While they can be found throughout the house, these microscopic creatures thrive in warm, humid environments such as bedding, upholstered furniture and carpeting. Because so much time is spent in the bedroom, it is essential to reduce mite levels there.  Encase pillows, mattresses, and box springs in special allergen-proof fabric covers or airtight, zippered plastic covers.  Bedding should be washed weekly in hot water (130 F) and dried in a hot dryer. Allergen-proof covers are available for comforters and pillows that can't be regularly washed.  Wash the allergy-proof covers every few months. Minimize clutter in the bedroom. Keep pets out of the bedroom.  Keep humidity less than 50% by using a dehumidifier or air conditioning. You can buy a humidity measuring device called a hygrometer to monitor this.  If possible, replace carpets with hardwood, linoleum, or washable area rugs. If that's not possible, vacuum frequently with a vacuum that has a HEPA filter. Remove all upholstered furniture and non-washable window drapes from the bedroom. Remove all non-washable stuffed toys from the bedroom.  Wash stuffed toys weekly.  Mold Control Mold and fungi can grow on a variety  of surfaces provided certain temperature  and moisture conditions exist.  Outdoor molds grow on plants, decaying vegetation and soil. The major outdoor mold, Alternaria and Cladosporium, are found in very high numbers during hot and dry conditions. Generally, a late summer - fall peak is seen for common outdoor fungal spores. Rain will temporarily lower outdoor mold spore count, but counts rise rapidly when the rainy period ends. The most important indoor molds are Aspergillus and Penicillium. Dark, humid and poorly ventilated basements are ideal sites for mold growth. The next most common sites of mold growth are the bathroom and the kitchen. Outdoor (Seasonal) Mold Control Use air conditioning and keep windows closed. Avoid exposure to decaying vegetation. Avoid leaf raking. Avoid grain handling. Consider wearing a face mask if working in moldy areas.  Indoor (Perennial) Mold Control  Maintain humidity below 50%. Get rid of mold growth on hard surfaces with water, detergent and, if necessary, 5% bleach (do not mix with other cleaners). Then dry the area completely. If mold covers an area more than 10 square feet, consider hiring an indoor environmental professional. For clothing, washing with soap and water is best. If moldy items cannot be cleaned and dried, throw them away. Remove sources e.g. contaminated carpets. Repair and seal leaking roofs or pipes. Using dehumidifiers in damp basements may be helpful, but empty the water and clean units regularly to prevent mildew from forming. All rooms, especially basements, bathrooms and kitchens, require ventilation and cleaning to deter mold and mildew growth. Avoid carpeting on concrete or damp floors, and storing items in damp areas.

## 2023-04-10 DIAGNOSIS — J3089 Other allergic rhinitis: Secondary | ICD-10-CM | POA: Diagnosis not present

## 2023-04-10 NOTE — Progress Notes (Signed)
Aeroallergen Immunotherapy  Ordering Provider: Dr. Wyline Mood  Patient Details Name: BERKE SCHICK MRN: [id number] Date of Birth: 1961/04/17  Order 1 of 2  Vial Label: G-RW-W-T  0.3 ml (Volume)  BAU Concentration -- 7 Grass Mix* 100,000 (1 [place], Fisher, Stout, [place] Rye, RedTop, Sweet Vernal, Timothy) 0.2 ml (Volume)  1:20 Concentration -- Bahia 0.3 ml (Volume)  BAU Concentration -- [place] 10,000 0.2 ml (Volume)  1:20 Concentration -- Johnson 0.3 ml (Volume)  1:20 Concentration -- Ragweed Mix 0.2 ml (Volume)  1:40 Concentration -- Baccharis 0.2 ml (Volume)  1:80 Concentration -- Dogfennel 0.5 ml (Volume)  1:20 Concentration -- Weed Mix* 0.5 ml (Volume)  1:20 Concentration -- Eastern 10 Tree Mix (also Sweet Gum) 0.2 ml (Volume)  1:10 Concentration -- Cedar, red 0.2 ml (Volume)  1:10 Concentration -- Pecan Pollen 0.2 ml (Volume)  1:10 Concentration -- Pine Mix 0.2 ml (Volume)  1:20 Concentration -- Red Mulberry 0.2 ml (Volume)  1:20 Concentration -- Walnut, Black Pollen   3.7  ml Extract Subtotal 1.3  ml Diluent 5.0  ml Maintenance Total  Schedule:  B Blue Vial (1:100,000): Schedule B (6 doses) Yellow Vial (1:10,000): Schedule B (6 doses) Green Vial (1:1,000): Schedule B (6 doses) Red Vial (1:100): Schedule A (14 doses)  Special Instructions: may build up 1-2 times per week. First red vial - schedule B, once on red 0.5cc go to every 2 weeks. Second and subsequent red vials - build up weekly with faster schedule (0.1, 0.3, 0.5). Once on red 0.5cc go to every 4 weeks as maintenance.

## 2023-04-10 NOTE — Progress Notes (Signed)
Aeroallergen Immunotherapy  Ordering Provider: Dr. Wyline Mood  Patient Details Name: Steven Figueroa MRN: [id number] Date of Birth: September 01, 1960  Order 2 of 2  Vial Label: M-Dm  0.2 ml (Volume)  1:10 Concentration -- Fusarium moniliforme 0.2 ml (Volume)  1:40 Concentration -- Aureobasidium pullulans 0.2 ml (Volume)  1:10 Concentration -- Rhizopus oryzae 0.5 ml (Volume)   AU Concentration -- Mite Mix (DF 5,000 & DP 5,000)   1.6  ml Extract Subtotal 3.4  ml Diluent 5.0  ml Maintenance Total  Schedule:  B Blue Vial (1:100,000): Schedule B (6 doses) Yellow Vial (1:10,000): Schedule B (6 doses) Green Vial (1:1,000): Schedule B (6 doses) Red Vial (1:100): Schedule A (14 doses)  Special Instructions: may build up 1-2 times per week. First red vial - schedule B, once on red 0.5cc go to every 2 weeks. Second and subsequent red vials - build up weekly with faster schedule (0.1, 0.3, 0.5). Once on red 0.5cc go to every 4 weeks as maintenance.

## 2023-04-10 NOTE — Progress Notes (Signed)
Aeroallergen Immunotherapy  Ordering Provider: Dr. Wyline Mood  Patient Details Name: Steven Figueroa MRN: [id number] Date of Birth: 1961/04/17  Order 1 of 2  Vial Label: G-RW-W-T  0.3 ml (Volume)  BAU Concentration -- 7 Grass Mix* 100,000 (1 [place], Fisher, Stout, [place] Rye, RedTop, Sweet Vernal, Timothy) 0.2 ml (Volume)  1:20 Concentration -- Bahia 0.3 ml (Volume)  BAU Concentration -- [place] 10,000 0.2 ml (Volume)  1:20 Concentration -- Johnson 0.3 ml (Volume)  1:20 Concentration -- Ragweed Mix 0.2 ml (Volume)  1:40 Concentration -- Baccharis 0.2 ml (Volume)  1:80 Concentration -- Dogfennel 0.5 ml (Volume)  1:20 Concentration -- Weed Mix* 0.5 ml (Volume)  1:20 Concentration -- Eastern 10 Tree Mix (also Sweet Gum) 0.2 ml (Volume)  1:10 Concentration -- Cedar, red 0.2 ml (Volume)  1:10 Concentration -- Pecan Pollen 0.2 ml (Volume)  1:10 Concentration -- Pine Mix 0.2 ml (Volume)  1:20 Concentration -- Red Mulberry 0.2 ml (Volume)  1:20 Concentration -- Walnut, Black Pollen   3.7  ml Extract Subtotal 1.3  ml Diluent 5.0  ml Maintenance Total  Schedule:  B Blue Vial (1:100,000): Schedule B (6 doses) Yellow Vial (1:10,000): Schedule B (6 doses) Green Vial (1:1,000): Schedule B (6 doses) Red Vial (1:100): Schedule A (14 doses)  Special Instructions: may build up 1-2 times per week. First red vial - schedule B, once on red 0.5cc go to every 2 weeks. Second and subsequent red vials - build up weekly with faster schedule (0.1, 0.3, 0.5). Once on red 0.5cc go to every 4 weeks as maintenance.

## 2023-04-10 NOTE — Progress Notes (Signed)
EXP 04/09/24

## 2023-04-11 DIAGNOSIS — J301 Allergic rhinitis due to pollen: Secondary | ICD-10-CM | POA: Diagnosis not present

## 2023-04-19 ENCOUNTER — Telehealth: Payer: Self-pay | Admitting: Allergy

## 2023-04-19 NOTE — Telephone Encounter (Signed)
Patient's authorization, [id number] expires 05-13-2023.   Faxed renewal of authorization request to [facility], [phone] and emailed it to [email or web address].   Called and spoke to patient about expiration date. Patient states he saw allergist at the [place] a couple of months ago and already received new one. Patient states he will look for it and bring it in with him on at his appointment to start injections. Advised patient I would still send off renewal just as a precaution. Patient verbalized understanding.

## 2023-04-25 ENCOUNTER — Telehealth: Payer: Self-pay | Admitting: *Deleted

## 2023-04-25 NOTE — Telephone Encounter (Signed)
Called and spoke to the patient and informed him that he is still good to restart his allergy injections tomorrow per Albin Felling since his referral is still current until the end of October. I did ask if he received anything in the mail from the [place] regarding his updated referral. He stated that no he didn't have anything in the mail, he stated that when he called last week and spoke with someone with Allergy and Asthma they advised to him that we needed to get his provider to send a letter to the [place]? I advised that we would look into this for him. Patient verbalized understanding.

## 2023-04-26 ENCOUNTER — Ambulatory Visit (INDEPENDENT_AMBULATORY_CARE_PROVIDER_SITE_OTHER): Payer: No Typology Code available for payment source | Admitting: *Deleted

## 2023-04-26 ENCOUNTER — Telehealth: Payer: Self-pay | Admitting: *Deleted

## 2023-04-26 DIAGNOSIS — J309 Allergic rhinitis, unspecified: Secondary | ICD-10-CM

## 2023-04-26 MED ORDER — EPINEPHRINE 0.3 MG/0.3ML IJ SOAJ: 0.3000 mg | INTRAMUSCULAR | 1 refills | Status: DC | PRN

## 2023-04-26 NOTE — Telephone Encounter (Signed)
Noted  

## 2023-04-26 NOTE — Telephone Encounter (Signed)
Patient came in today to re-start his allergy injections, he stated that last Thursday he had 2 allergic reactions involving itching and pain. He administered Epinephrine at 2 different times through out the day and did not seek medical care. He is wondering if it was something he ate or if it is his diabetic neuropathy. I did print out his emergency action plan and go over it in person so that he knows when to use Epinephrine and when to take antihistamines. I did scheduled for him to see you in office next week following up with the itching and pain just to be sure.

## 2023-04-26 NOTE — Progress Notes (Signed)
Immunotherapy   Patient Details  Name: Steven Figueroa MRN: [id number] Date of Birth: 1961-04-20  04/26/2023  Debby Bud started injections for  G-RW-W-T, M-DM Following schedule: B  Frequency:2 times per week Epi-Pen:Epi-Pen Available  Consent signed and patient instructions given. Patient started allergy injections today and received 0.55mL of G-RW-W-T in the RUA and 0.90mL of M-DM in the LUA. Patient waited 30 minutes in office and did not experience any issues.    Itzel Lowrimore Fernandez-Vernon 04/26/2023, 11:23 AM

## 2023-05-02 NOTE — Progress Notes (Unsigned)
Follow Up Note  RE: TORIN MODICA MRN: [id number] DOB: 11-Mar-1961 Date of Office Visit: 05/03/2023  Referring provider: [facility] Primary care provider: [facility]  Chief Complaint: No chief complaint on file.  History of Present Illness: I had the pleasure of seeing [name] for a follow up visit at the [facility] on 05/02/2023. He is a 62 y.o. male, who is being followed for allergic rhinitis. His previous allergy office visit was on 04/05/2023 with Dr. Selena Batten. Today is a new complaint visit of adverse food reactions .  Discussed the use of AI scribe software for clinical note transcription with the patient, who gave verbal consent to proceed.  History of Present Illness            Other allergic rhinitis Seasonal allergic rhinitis due to pollen Allergic rhinitis due to dust mite Allergic rhinitis due to mold Past history - Patient follows with ophthalmology for glaucoma injections. 2021 skin testing positive to grass, weed, ragweed, trees, mold, dust mites, dog. Started AIT on 04/01/2020 (G-W-RW-T, M-DM-D) and stopped in Feb 2024 with worsening symptoms now.  Interim history -  Today's skin testing positive to grass, ragweed, weed, trees, mold and dust mites.  Start environmental control measures as below. Use over the counter antihistamines such as Zyrtec (cetirizine), Claritin (loratadine), Allegra (fexofenadine), or Xyzal (levocetirizine) daily as needed. May take twice a day during allergy flares. May switch antihistamines every few months. Continue Singulair (montelukast) 10mg  daily at night. Use azelastine nasal spray 1-2 sprays per nostril twice a day as needed for runny nose/drainage. Restart allergy injections - 2 injections.    Return in about 6 months (around 10/03/2023).  Assessment and Plan: [name] is a 62 y.o. male with: *** Assessment and Plan              No follow-ups on file.  No orders of the defined  types were placed in this encounter.  Lab Orders  No laboratory test(s) ordered today    Diagnostics: Spirometry:  Tracings reviewed. His effort: {Blank single:19197::"Good reproducible efforts.","It was hard to get consistent efforts and there is a question as to whether this reflects a maximal maneuver.","Poor effort, data can not be interpreted."} FVC: ***L FEV1: ***L, ***% predicted FEV1/FVC ratio: ***% Interpretation: {Blank single:19197::"Spirometry consistent with mild obstructive disease","Spirometry consistent with moderate obstructive disease","Spirometry consistent with severe obstructive disease","Spirometry consistent with possible restrictive disease","Spirometry consistent with mixed obstructive and restrictive disease","Spirometry uninterpretable due to technique","Spirometry consistent with normal pattern","No overt abnormalities noted given today's efforts"}.  Please see scanned spirometry results for details.  Skin Testing: {Blank single:19197::"Select foods","Environmental allergy panel","Environmental allergy panel and select foods","Food allergy panel","None","Deferred due to recent antihistamines use"}. *** Results discussed with patient/family.   Medication List:  Current Outpatient Medications  Medication Sig Dispense Refill  . acetaminophen (TYLENOL) 500 MG tablet Take 2 tablets (1,000 mg total) by mouth every 6 (six) hours. 60 tablet 0  . amLODipine (NORVASC) 10 MG tablet Take 10 mg by mouth daily.    Marland Kitchen aspirin 81 MG EC tablet Take 81 mg by mouth daily.    Marland Kitchen azaTHIOprine (IMURAN) 50 MG tablet Take 50 mg by mouth in the morning and at bedtime.    Marland Kitchen azelastine (ASTELIN) 0.1 % nasal spray Place 1 spray into both nostrils 2 (two) times daily as needed for rhinitis. Use in each nostril as directed 90 mL 3  . Brinzolamide-Brimonidine 1-0.2 % SUSP Place 1 drop into both eyes  in the morning, at noon, and at bedtime.    . capsaicin (ZOSTRIX) 0.025 % cream Apply 1  Application topically 2 (two) times daily.    . cetirizine (ZYRTEC ALLERGY) 10 MG tablet Take 1 tablet (10 mg total) by mouth in the morning. 90 tablet 3  . Cholecalciferol 50 MCG (2000 UT) TABS Take 2,000 Units by mouth daily.    Marland Kitchen EPINEPHrine (EPIPEN 2-PAK) 0.3 mg/0.3 mL IJ SOAJ injection Inject 0.3 mg into the muscle as needed for anaphylaxis. 0.3 mL 1  . EPINEPHrine 0.3 mg/0.3 mL IJ SOAJ injection Inject 0.3 mg into the muscle as needed for anaphylaxis. 2 each 1  . ferrous sulfate 325 (65 FE) MG tablet Take 325 mg by mouth every other day.    . gabapentin (NEURONTIN) 300 MG capsule Take 300 mg by mouth 3 (three) times daily.    Marland Kitchen glucose blood test strip     . insulin aspart (NOVOLOG) 100 UNIT/ML injection Inject 8 Units into the skin 3 (three) times daily before meals.    . insulin glargine (LANTUS) 100 UNIT/ML injection Inject 22 Units into the skin in the morning.    . latanoprost (XALATAN) 0.005 % ophthalmic solution Place 1 drop into both eyes at bedtime.    Marland Kitchen losartan (COZAAR) 100 MG tablet Take 100 mg by mouth daily.    . Magnesium Oxide 420 MG TABS Take 420 mg by mouth in the morning and at bedtime.    . methazolamide (NEPTAZANE) 50 MG tablet Take 50 mg by mouth 2 (two) times daily.    . modafinil (PROVIGIL) 200 MG tablet Take 200 mg by mouth daily.    . montelukast (SINGULAIR) 10 MG tablet Take 1 tablet (10 mg total) by mouth at bedtime. 90 tablet 3  . Multiple Vitamin (MULTIVITAMIN WITH MINERALS) TABS tablet Take 1 tablet by mouth daily.    . Omega-3 Fatty Acids (FISH OIL) 1000 MG CAPS Take 1,000 mg by mouth daily.    . pioglitazone (ACTOS) 45 MG tablet Take 45 mg by mouth daily.    . pravastatin (PRAVACHOL) 20 MG tablet Take 20 mg by mouth at bedtime.    . sulfamethoxazole-trimethoprim (BACTRIM DS) 800-160 MG tablet Take 1 tablet by mouth 2 (two) times daily. 14 tablet 0  . timolol (TIMOPTIC) 0.5 % ophthalmic solution Place 1 drop into both eyes 2 (two) times daily.     No  current facility-administered medications for this visit.   Allergies: Allergies  Allergen Reactions  . Codeine Itching  . Iodine     Pt states he's no longer allergic to iodine  . Morphine Itching   I reviewed his past medical history, social history, family history, and environmental history and no significant changes have been reported from his previous visit.  Review of Systems  Constitutional:  Negative for appetite change, chills, fever and unexpected weight change.  HENT:  Positive for rhinorrhea and sneezing. Negative for congestion, nosebleeds and postnasal drip.   Respiratory:  Negative for cough, chest tightness, shortness of breath and wheezing.   Cardiovascular:  Negative for chest pain.  Gastrointestinal:  Negative for abdominal pain.  Genitourinary:  Negative for difficulty urinating.  Skin:  Negative for rash.  Allergic/Immunologic: Positive for environmental allergies.  Neurological:  Negative for headaches.    Objective: There were no vitals taken for this visit. There is no height or weight on file to calculate BMI. Physical Exam Vitals and nursing note reviewed.  Constitutional:  Appearance: Normal appearance. He is well-developed.  HENT:     Head: Normocephalic and atraumatic.     Right Ear: Tympanic membrane and external ear normal.     Left Ear: Tympanic membrane and external ear normal.     Nose: Nose normal.     Mouth/Throat:     Mouth: Mucous membranes are moist.     Pharynx: Oropharynx is clear.  Eyes:     Conjunctiva/sclera: Conjunctivae normal.  Cardiovascular:     Rate and Rhythm: Normal rate and regular rhythm.     Heart sounds: Normal heart sounds. No murmur heard.    No friction rub. No gallop.  Pulmonary:     Effort: Pulmonary effort is normal.     Breath sounds: Normal breath sounds. No wheezing, rhonchi or rales.  Musculoskeletal:     Cervical back: Neck supple.  Skin:    General: Skin is warm.     Findings: No rash.   Neurological:     Mental Status: He is alert and oriented to person, place, and time.  Psychiatric:        Behavior: Behavior normal.  Previous notes and tests were reviewed. The plan was reviewed with the patient/family, and all questions/concerned were addressed.  It was my pleasure to see Steven Figueroa today and participate in his care. Please feel free to contact me with any questions or concerns.  Sincerely,  Wyline Mood, DO Allergy & Immunology  [facility] office: [phone] [facility] office: [phone]

## 2023-05-03 ENCOUNTER — Ambulatory Visit (INDEPENDENT_AMBULATORY_CARE_PROVIDER_SITE_OTHER): Payer: No Typology Code available for payment source | Admitting: *Deleted

## 2023-05-03 ENCOUNTER — Ambulatory Visit: Payer: No Typology Code available for payment source | Admitting: Allergy

## 2023-05-03 ENCOUNTER — Other Ambulatory Visit: Payer: Self-pay

## 2023-05-03 ENCOUNTER — Encounter: Payer: Self-pay | Admitting: Allergy

## 2023-05-03 VITALS — BP 138/70 | HR 89 | Temp 98.3°F | Resp 16 | Wt 207.1 lb

## 2023-05-03 DIAGNOSIS — J309 Allergic rhinitis, unspecified: Secondary | ICD-10-CM

## 2023-05-03 DIAGNOSIS — J3089 Other allergic rhinitis: Secondary | ICD-10-CM

## 2023-05-03 DIAGNOSIS — J301 Allergic rhinitis due to pollen: Secondary | ICD-10-CM

## 2023-05-03 DIAGNOSIS — J3081 Allergic rhinitis due to animal (cat) (dog) hair and dander: Secondary | ICD-10-CM | POA: Diagnosis not present

## 2023-05-03 MED ORDER — EPINEPHRINE 0.3 MG/0.3ML IJ SOAJ: 0.3000 mg | INTRAMUSCULAR | 1 refills | Status: AC | PRN

## 2023-05-03 NOTE — Patient Instructions (Addendum)
Environmental allergies 2024 skin testing Positive to grass, ragweed, weed, trees, mold and dust mites.  Continue environmental control measures as below. Take Zyrtec (cetirizine) 10mg  daily. May take twice a day during allergy flares.  Continue Singulair (montelukast) 10mg  daily at night. Use azelastine nasal spray 1-2 sprays per nostril twice a day as needed for runny nose/drainage.  Continue allergy injections - 2 injections.  I have prescribed epinephrine injectable device. For mild symptoms you can take over the counter antihistamines such as Benadryl 1-2 tablets = 25-50mg  and monitor symptoms closely. If symptoms worsen or if you have severe symptoms including breathing issues, throat closure, significant swelling, whole body hives, severe diarrhea and vomiting, lightheadedness then inject epinephrine and seek immediate medical care afterwards. Emergency action plan given.  Return in about 6 months (around 11/01/2023). Or sooner if needed.   Reducing Pollen Exposure Pollen seasons: trees (spring), grass (summer) and ragweed/weeds (fall). Keep windows closed in your home and car to lower pollen exposure.  Install air conditioning in the bedroom and throughout the house if possible.  Avoid going out in dry windy days - especially early morning. Pollen counts are highest between 5 - 10 AM and on dry, hot and windy days.  Save outside activities for late afternoon or after a heavy rain, when pollen levels are lower.  Avoid mowing of grass if you have grass pollen allergy. Be aware that pollen can also be transported indoors on people and pets.  Dry your clothes in an automatic dryer rather than hanging them outside where they might collect pollen.  Rinse hair and eyes before bedtime.  Control of House Dust Mite Allergen Dust mite allergens are a common trigger of allergy and asthma symptoms. While they can be found throughout the house, these microscopic creatures thrive in warm, humid  environments such as bedding, upholstered furniture and carpeting. Because so much time is spent in the bedroom, it is essential to reduce mite levels there.  Encase pillows, mattresses, and box springs in special allergen-proof fabric covers or airtight, zippered plastic covers.  Bedding should be washed weekly in hot water (130 F) and dried in a hot dryer. Allergen-proof covers are available for comforters and pillows that can't be regularly washed.  Wash the allergy-proof covers every few months. Minimize clutter in the bedroom. Keep pets out of the bedroom.  Keep humidity less than 50% by using a dehumidifier or air conditioning. You can buy a humidity measuring device called a hygrometer to monitor this.  If possible, replace carpets with hardwood, linoleum, or washable area rugs. If that's not possible, vacuum frequently with a vacuum that has a HEPA filter. Remove all upholstered furniture and non-washable window drapes from the bedroom. Remove all non-washable stuffed toys from the bedroom.  Wash stuffed toys weekly.  Mold Control Mold and fungi can grow on a variety of surfaces provided certain temperature and moisture conditions exist.  Outdoor molds grow on plants, decaying vegetation and soil. The major outdoor mold, Alternaria and Cladosporium, are found in very high numbers during hot and dry conditions. Generally, a late summer - fall peak is seen for common outdoor fungal spores. Rain will temporarily lower outdoor mold spore count, but counts rise rapidly when the rainy period ends. The most important indoor molds are Aspergillus and Penicillium. Dark, humid and poorly ventilated basements are ideal sites for mold growth. The next most common sites of mold growth are the bathroom and the kitchen. Outdoor (Seasonal) Mold Control Use air conditioning and  keep windows closed. Avoid exposure to decaying vegetation. Avoid leaf raking. Avoid grain handling. Consider wearing a face mask  if working in moldy areas.  Indoor (Perennial) Mold Control  Maintain humidity below 50%. Get rid of mold growth on hard surfaces with water, detergent and, if necessary, 5% bleach (do not mix with other cleaners). Then dry the area completely. If mold covers an area more than 10 square feet, consider hiring an indoor environmental professional. For clothing, washing with soap and water is best. If moldy items cannot be cleaned and dried, throw them away. Remove sources e.g. contaminated carpets. Repair and seal leaking roofs or pipes. Using dehumidifiers in damp basements may be helpful, but empty the water and clean units regularly to prevent mildew from forming. All rooms, especially basements, bathrooms and kitchens, require ventilation and cleaning to deter mold and mildew growth. Avoid carpeting on concrete or damp floors, and storing items in damp areas.

## 2023-05-10 ENCOUNTER — Ambulatory Visit (INDEPENDENT_AMBULATORY_CARE_PROVIDER_SITE_OTHER): Payer: No Typology Code available for payment source | Admitting: *Deleted

## 2023-05-10 DIAGNOSIS — J309 Allergic rhinitis, unspecified: Secondary | ICD-10-CM | POA: Diagnosis not present

## 2023-05-15 ENCOUNTER — Ambulatory Visit (INDEPENDENT_AMBULATORY_CARE_PROVIDER_SITE_OTHER): Payer: No Typology Code available for payment source | Admitting: *Deleted

## 2023-05-15 DIAGNOSIS — J309 Allergic rhinitis, unspecified: Secondary | ICD-10-CM | POA: Diagnosis not present

## 2023-05-23 ENCOUNTER — Ambulatory Visit (INDEPENDENT_AMBULATORY_CARE_PROVIDER_SITE_OTHER): Payer: No Typology Code available for payment source | Admitting: *Deleted

## 2023-05-23 DIAGNOSIS — J309 Allergic rhinitis, unspecified: Secondary | ICD-10-CM

## 2023-06-01 ENCOUNTER — Ambulatory Visit (INDEPENDENT_AMBULATORY_CARE_PROVIDER_SITE_OTHER): Payer: No Typology Code available for payment source

## 2023-06-01 DIAGNOSIS — J309 Allergic rhinitis, unspecified: Secondary | ICD-10-CM | POA: Diagnosis not present

## 2023-06-08 ENCOUNTER — Ambulatory Visit (INDEPENDENT_AMBULATORY_CARE_PROVIDER_SITE_OTHER): Payer: No Typology Code available for payment source

## 2023-06-08 DIAGNOSIS — J309 Allergic rhinitis, unspecified: Secondary | ICD-10-CM

## 2023-06-14 ENCOUNTER — Ambulatory Visit (INDEPENDENT_AMBULATORY_CARE_PROVIDER_SITE_OTHER): Payer: No Typology Code available for payment source

## 2023-06-14 DIAGNOSIS — J309 Allergic rhinitis, unspecified: Secondary | ICD-10-CM

## 2023-06-22 ENCOUNTER — Ambulatory Visit (INDEPENDENT_AMBULATORY_CARE_PROVIDER_SITE_OTHER): Payer: No Typology Code available for payment source | Admitting: *Deleted

## 2023-06-22 DIAGNOSIS — J309 Allergic rhinitis, unspecified: Secondary | ICD-10-CM

## 2023-06-27 ENCOUNTER — Ambulatory Visit (INDEPENDENT_AMBULATORY_CARE_PROVIDER_SITE_OTHER): Payer: No Typology Code available for payment source

## 2023-06-27 DIAGNOSIS — J309 Allergic rhinitis, unspecified: Secondary | ICD-10-CM | POA: Diagnosis not present

## 2023-07-04 ENCOUNTER — Ambulatory Visit (INDEPENDENT_AMBULATORY_CARE_PROVIDER_SITE_OTHER): Payer: No Typology Code available for payment source | Admitting: *Deleted

## 2023-07-04 DIAGNOSIS — J309 Allergic rhinitis, unspecified: Secondary | ICD-10-CM

## 2023-07-20 ENCOUNTER — Ambulatory Visit (INDEPENDENT_AMBULATORY_CARE_PROVIDER_SITE_OTHER): Payer: No Typology Code available for payment source

## 2023-07-20 DIAGNOSIS — J309 Allergic rhinitis, unspecified: Secondary | ICD-10-CM

## 2023-07-27 ENCOUNTER — Ambulatory Visit (INDEPENDENT_AMBULATORY_CARE_PROVIDER_SITE_OTHER): Payer: No Typology Code available for payment source

## 2023-07-27 DIAGNOSIS — J309 Allergic rhinitis, unspecified: Secondary | ICD-10-CM | POA: Diagnosis not present

## 2023-08-02 ENCOUNTER — Ambulatory Visit: Payer: No Typology Code available for payment source | Admitting: Allergy

## 2023-08-02 ENCOUNTER — Ambulatory Visit (INDEPENDENT_AMBULATORY_CARE_PROVIDER_SITE_OTHER): Payer: No Typology Code available for payment source | Admitting: *Deleted

## 2023-08-02 ENCOUNTER — Ambulatory Visit: Payer: Self-pay | Admitting: *Deleted

## 2023-08-02 DIAGNOSIS — J309 Allergic rhinitis, unspecified: Secondary | ICD-10-CM

## 2023-08-08 ENCOUNTER — Ambulatory Visit (INDEPENDENT_AMBULATORY_CARE_PROVIDER_SITE_OTHER): Payer: No Typology Code available for payment source | Admitting: *Deleted

## 2023-08-08 DIAGNOSIS — J309 Allergic rhinitis, unspecified: Secondary | ICD-10-CM | POA: Diagnosis not present

## 2023-08-22 ENCOUNTER — Telehealth: Payer: Self-pay

## 2023-08-22 NOTE — Telephone Encounter (Signed)
Patient called to inform us that he had surgery and will not be in for the next 3 weeks until he gets release by his provider.

## 2023-09-04 ENCOUNTER — Ambulatory Visit (INDEPENDENT_AMBULATORY_CARE_PROVIDER_SITE_OTHER): Payer: No Typology Code available for payment source

## 2023-09-04 DIAGNOSIS — J309 Allergic rhinitis, unspecified: Secondary | ICD-10-CM | POA: Diagnosis not present

## 2023-09-13 ENCOUNTER — Ambulatory Visit (INDEPENDENT_AMBULATORY_CARE_PROVIDER_SITE_OTHER): Payer: No Typology Code available for payment source | Admitting: *Deleted

## 2023-09-13 DIAGNOSIS — J309 Allergic rhinitis, unspecified: Secondary | ICD-10-CM

## 2023-09-21 ENCOUNTER — Ambulatory Visit (INDEPENDENT_AMBULATORY_CARE_PROVIDER_SITE_OTHER): Payer: Self-pay

## 2023-09-21 DIAGNOSIS — J309 Allergic rhinitis, unspecified: Secondary | ICD-10-CM

## 2023-09-28 ENCOUNTER — Ambulatory Visit (INDEPENDENT_AMBULATORY_CARE_PROVIDER_SITE_OTHER)

## 2023-09-28 DIAGNOSIS — J309 Allergic rhinitis, unspecified: Secondary | ICD-10-CM

## 2023-10-03 NOTE — Progress Notes (Deleted)
 Follow Up Note  RE: Steven Figueroa MRN: [id number] DOB: 1960/08/20 Date of Office Visit: 10/04/2023  [facility]: [facility] care provider[facility]  Chief Complaint: No chief complaint on file.  History of Present Illness: I had the pleasure of seeing Steven Figueroa for a follow up visit at the [facility] on 10/03/2023. He is a 63 y.o. male, who is being followed for allergic rhinitis on AIT. His previous allergy office visit was on 05/03/2023 with Dr. Selena Batten. Today is a regular follow up visit.  Discussed the use of AI scribe software for clinical note transcription with the patient, who gave verbal consent to proceed.  History of Present Illness            ***  Assessment and Plan: Steven Figueroa is a 63 y.o. male with: Seasonal allergic rhinitis due to pollen Allergic rhinitis due to dust mite Allergic rhinitis due to mold Allergic rhinitis due to animal dander Past history - Patient follows with ophthalmology for glaucoma injections. 2021 skin testing positive to grass, weed, ragweed, trees, mold, dust mites, dog. Started AIT on 04/01/2020 (G-W-RW-T, M-DM-D) and stopped in Feb 2024 with worsening symptoms now. 2024 skin testing positive to grass, ragweed, weed, trees, mold and dust mites.  Interim history - restarted AIT on 04/26/2023 (G-RW-W-T, M-DM) with no issues.  Today's skin testing positive to grass, ragweed, weed, trees, mold and dust mites.  Continue environmental control measures as below. Take Zyrtec (cetirizine) 10mg  daily. May take twice a day during allergy flares.  Continue Singulair (montelukast) 10mg  daily at night. Use azelastine nasal spray 1-2 sprays per nostril twice a day as needed for runny nose/drainage. Continue allergy injections - 2 injections given today.  I have prescribed epinephrine injectable device. For mild symptoms you can take over the counter antihistamines such as Benadryl 1-2 tablets = 25-50mg   and monitor symptoms closely. If symptoms worsen or if you have severe symptoms including breathing issues, throat closure, significant swelling, whole body hives, severe diarrhea and vomiting, lightheadedness then inject epinephrine and seek immediate medical care afterwards. Emergency action plan given.   Misuse of Epinephrine Autoinjector Used EpiPen for non-anaphylactic symptoms (itching) likely related to poorly controlled diabetes. Provided education on appropriate use of EpiPen for severe allergic reactions. Refill EpiPen prescription due to ongoing allergy shots. Assessment and Plan              No follow-ups on file.  No orders of the defined types were placed in this encounter.  Lab Orders  No laboratory test(s) ordered today    Diagnostics: Spirometry:  Tracings reviewed. His effort: {Blank single:19197::"Good reproducible efforts.","It was hard to get consistent efforts and there is a question as to whether this reflects a maximal maneuver.","Poor effort, data can not be interpreted."} FVC: ***L FEV1: ***L, ***% predicted FEV1/FVC ratio: ***% Interpretation: {Blank single:19197::"Spirometry consistent with mild obstructive disease","Spirometry consistent with moderate obstructive disease","Spirometry consistent with severe obstructive disease","Spirometry consistent with possible restrictive disease","Spirometry consistent with mixed obstructive and restrictive disease","Spirometry uninterpretable due to technique","Spirometry consistent with normal pattern","No overt abnormalities noted given today's efforts"}.  Please see scanned spirometry results for details.  Skin Testing: {Blank single:19197::"Select foods","Environmental allergy panel","Environmental allergy panel and select foods","Food allergy panel","None","Deferred due to recent antihistamines use"}. *** Results discussed with patient/family.   Medication List:  Current Outpatient Medications  Medication  Sig Dispense Refill  . acetaminophen (TYLENOL) 500 MG tablet Take 2 tablets (1,000 mg total) by mouth every  6 (six) hours. 60 tablet 0  . amLODipine (NORVASC) 10 MG tablet Take 10 mg by mouth daily.    Marland Kitchen aspirin 81 MG EC tablet Take 81 mg by mouth daily.    Marland Kitchen azaTHIOprine (IMURAN) 50 MG tablet Take 50 mg by mouth in the morning and at bedtime.    Marland Kitchen azelastine (ASTELIN) 0.1 % nasal spray Place 1 spray into both nostrils 2 (two) times daily as needed for rhinitis. Use in each nostril as directed 90 mL 3  . Brinzolamide-Brimonidine 1-0.2 % SUSP Place 1 drop into both eyes in the morning, at noon, and at bedtime.    . capsaicin (ZOSTRIX) 0.025 % cream Apply 1 Application topically 2 (two) times daily.    . cetirizine (ZYRTEC ALLERGY) 10 MG tablet Take 1 tablet (10 mg total) by mouth in the morning. 90 tablet 3  . Cholecalciferol 50 MCG (2000 UT) TABS Take 2,000 Units by mouth daily.    Marland Kitchen EPINEPHrine 0.3 mg/0.3 mL IJ SOAJ injection Inject 0.3 mg into the muscle as needed for anaphylaxis. 2 each 1  . ferrous sulfate 325 (65 FE) MG tablet Take 325 mg by mouth every other day.    . gabapentin (NEURONTIN) 300 MG capsule Take 300 mg by mouth 3 (three) times daily.    Marland Kitchen glucose blood test strip     . insulin aspart (NOVOLOG) 100 UNIT/ML injection Inject 8 Units into the skin 3 (three) times daily before meals.    . insulin glargine (LANTUS) 100 UNIT/ML injection Inject 22 Units into the skin in the morning.    . latanoprost (XALATAN) 0.005 % ophthalmic solution Place 1 drop into both eyes at bedtime.    Marland Kitchen losartan (COZAAR) 100 MG tablet Take 100 mg by mouth daily.    . Magnesium Oxide 420 MG TABS Take 420 mg by mouth in the morning and at bedtime.    . methazolamide (NEPTAZANE) 50 MG tablet Take 50 mg by mouth 2 (two) times daily.    . modafinil (PROVIGIL) 200 MG tablet Take 200 mg by mouth daily.    . montelukast (SINGULAIR) 10 MG tablet Take 1 tablet (10 mg total) by mouth at bedtime. 90 tablet 3  .  Multiple Vitamin (MULTIVITAMIN WITH MINERALS) TABS tablet Take 1 tablet by mouth daily.    . Omega-3 Fatty Acids (FISH OIL) 1000 MG CAPS Take 1,000 mg by mouth daily.    . pioglitazone (ACTOS) 45 MG tablet Take 45 mg by mouth daily.    . pravastatin (PRAVACHOL) 20 MG tablet Take 20 mg by mouth at bedtime.    . sulfamethoxazole-trimethoprim (BACTRIM DS) 800-160 MG tablet Take 1 tablet by mouth 2 (two) times daily. 14 tablet 0  . timolol (TIMOPTIC) 0.5 % ophthalmic solution Place 1 drop into both eyes 2 (two) times daily.     No current facility-administered medications for this visit.   Allergies: Allergies  Allergen Reactions  . Codeine Itching  . Iodine     Pt states he's no longer allergic to iodine  . Morphine Itching   I reviewed his past medical history, social history, family history, and environmental history and no significant changes have been reported from his previous visit.  Review of Systems  Constitutional:  Negative for appetite change, chills, fever and unexpected weight change.  HENT:  Positive for rhinorrhea and sneezing. Negative for congestion, nosebleeds and postnasal drip.   Respiratory:  Negative for cough, chest tightness, shortness of breath and wheezing.   Cardiovascular:  Negative for chest pain.  Gastrointestinal:  Negative for abdominal pain.  Genitourinary:  Negative for difficulty urinating.  Skin:  Negative for rash.  Allergic/Immunologic: Positive for environmental allergies.  Neurological:  Negative for headaches.   Objective: There were no vitals taken for this visit. There is no height or weight on file to calculate BMI. Physical Exam Vitals and nursing note reviewed.  Constitutional:      Appearance: Normal appearance. He is well-developed.  HENT:     Head: Normocephalic and atraumatic.     Right Ear: Tympanic membrane and external ear normal.     Left Ear: Tympanic membrane and external ear normal.     Nose: Nose normal.      Mouth/Throat:     Mouth: Mucous membranes are moist.     Pharynx: Oropharynx is clear.  Eyes:     Conjunctiva/sclera: Conjunctivae normal.  Cardiovascular:     Rate and Rhythm: Normal rate and regular rhythm.     Heart sounds: Normal heart sounds. No murmur heard.    No friction rub. No gallop.  Pulmonary:     Effort: Pulmonary effort is normal.     Breath sounds: Normal breath sounds. No wheezing, rhonchi or rales.  Musculoskeletal:     Cervical back: Neck supple.  Skin:    General: Skin is warm.     Findings: No rash.  Neurological:     Mental Status: He is alert and oriented to person, place, and time.  Psychiatric:        Behavior: Behavior normal.  Previous notes and tests were reviewed. The plan was reviewed with the patient/family, and all questions/concerned were addressed.  It was my pleasure to see Steven Figueroa today and participate in his care. Please feel free to contact me with any questions or concerns.  Sincerely,  Wyline Mood, DO Allergy & Immunology  [facility] office: [phone] [facility] office: [phone]

## 2023-10-04 ENCOUNTER — Ambulatory Visit: Payer: No Typology Code available for payment source | Admitting: Allergy

## 2023-10-04 DIAGNOSIS — J3089 Other allergic rhinitis: Secondary | ICD-10-CM

## 2023-10-04 DIAGNOSIS — J301 Allergic rhinitis due to pollen: Secondary | ICD-10-CM

## 2023-10-04 DIAGNOSIS — J3081 Allergic rhinitis due to animal (cat) (dog) hair and dander: Secondary | ICD-10-CM

## 2023-10-05 ENCOUNTER — Ambulatory Visit (INDEPENDENT_AMBULATORY_CARE_PROVIDER_SITE_OTHER)

## 2023-10-05 DIAGNOSIS — J309 Allergic rhinitis, unspecified: Secondary | ICD-10-CM | POA: Diagnosis not present

## 2023-10-12 ENCOUNTER — Ambulatory Visit (INDEPENDENT_AMBULATORY_CARE_PROVIDER_SITE_OTHER)

## 2023-10-12 DIAGNOSIS — J309 Allergic rhinitis, unspecified: Secondary | ICD-10-CM | POA: Diagnosis not present

## 2023-10-19 ENCOUNTER — Ambulatory Visit (INDEPENDENT_AMBULATORY_CARE_PROVIDER_SITE_OTHER)

## 2023-10-19 DIAGNOSIS — J309 Allergic rhinitis, unspecified: Secondary | ICD-10-CM | POA: Diagnosis not present

## 2023-10-24 ENCOUNTER — Ambulatory Visit (INDEPENDENT_AMBULATORY_CARE_PROVIDER_SITE_OTHER)

## 2023-10-24 DIAGNOSIS — J309 Allergic rhinitis, unspecified: Secondary | ICD-10-CM | POA: Diagnosis not present

## 2023-11-07 ENCOUNTER — Ambulatory Visit (INDEPENDENT_AMBULATORY_CARE_PROVIDER_SITE_OTHER)

## 2023-11-07 DIAGNOSIS — J309 Allergic rhinitis, unspecified: Secondary | ICD-10-CM | POA: Diagnosis not present

## 2023-11-14 ENCOUNTER — Ambulatory Visit (INDEPENDENT_AMBULATORY_CARE_PROVIDER_SITE_OTHER)

## 2023-11-14 DIAGNOSIS — J309 Allergic rhinitis, unspecified: Secondary | ICD-10-CM | POA: Diagnosis not present

## 2023-12-03 ENCOUNTER — Emergency Department (HOSPITAL_COMMUNITY)
Admission: EM | Admit: 2023-12-03 | Discharge: 2023-12-03 | Disposition: A | Discharge status: Home / Self Care | Attending: Emergency Medicine | Admitting: Emergency Medicine

## 2023-12-03 ENCOUNTER — Encounter (HOSPITAL_COMMUNITY): Payer: Self-pay

## 2023-12-03 ENCOUNTER — Other Ambulatory Visit: Payer: Self-pay

## 2023-12-03 DIAGNOSIS — Z794 Long term (current) use of insulin: Secondary | ICD-10-CM | POA: Insufficient documentation

## 2023-12-03 DIAGNOSIS — I10 Essential (primary) hypertension: Secondary | ICD-10-CM | POA: Diagnosis not present

## 2023-12-03 DIAGNOSIS — E109 Type 1 diabetes mellitus without complications: Secondary | ICD-10-CM | POA: Insufficient documentation

## 2023-12-03 DIAGNOSIS — E860 Dehydration: Secondary | ICD-10-CM | POA: Insufficient documentation

## 2023-12-03 DIAGNOSIS — E871 Hypo-osmolality and hyponatremia: Secondary | ICD-10-CM | POA: Diagnosis not present

## 2023-12-03 DIAGNOSIS — R531 Weakness: Secondary | ICD-10-CM | POA: Diagnosis present

## 2023-12-03 DIAGNOSIS — Z79899 Other long term (current) drug therapy: Secondary | ICD-10-CM | POA: Insufficient documentation

## 2023-12-03 DIAGNOSIS — Z7982 Long term (current) use of aspirin: Secondary | ICD-10-CM | POA: Diagnosis not present

## 2023-12-03 LAB — URINALYSIS, W/ REFLEX TO CULTURE (INFECTION SUSPECTED)
Bacteria, UA: NONE SEEN
Bilirubin Urine: NEGATIVE
Glucose, UA: >=500 mg/dL — AB
Hgb urine dipstick: NEGATIVE
Ketones, ur: NEGATIVE mg/dL
Leukocytes,Ua: NEGATIVE
Nitrite: NEGATIVE
Protein, ur: NEGATIVE mg/dL
Specific Gravity, Urine: 1.008 (ref 1.005–1.030)
pH: 7 (ref 5.0–8.0)

## 2023-12-03 LAB — COMPREHENSIVE METABOLIC PANEL WITH GFR
ALT: 24 U/L (ref 0–44)
AST: 23 U/L (ref 15–41)
Albumin: 3.5 g/dL (ref 3.5–5.0)
Alkaline Phosphatase: 81 U/L (ref 38–126)
Anion gap: 6 (ref 5–15)
BUN: 8 mg/dL (ref 8–23)
CO2: 26 mmol/L (ref 22–32)
Calcium: 8.9 mg/dL (ref 8.9–10.3)
Chloride: 102 mmol/L (ref 98–111)
Creatinine, Ser: 1.36 mg/dL — ABNORMAL HIGH (ref 0.61–1.24)
GFR, Estimated: 58 mL/min — ABNORMAL LOW (ref >60)
Glucose, Bld: 227 mg/dL — ABNORMAL HIGH (ref 70–99)
Potassium: 4.3 mmol/L (ref 3.5–5.1)
Sodium: 134 mmol/L — ABNORMAL LOW (ref 135–145)
Total Bilirubin: 0.6 mg/dL (ref 0.0–1.2)
Total Protein: 6.7 g/dL (ref 6.5–8.1)

## 2023-12-03 LAB — I-STAT VENOUS BLOOD GAS, ED
Acid-base deficit: 2 mmol/L (ref 0.0–2.0)
Bicarbonate: 24.2 mmol/L (ref 20.0–28.0)
Calcium, Ion: 1.24 mmol/L (ref 1.15–1.40)
HCT: 41 % (ref 39.0–52.0)
Hemoglobin: 13.9 g/dL (ref 13.0–17.0)
O2 Saturation: 52 %
Potassium: 4.4 mmol/L (ref 3.5–5.1)
Sodium: 137 mmol/L (ref 135–145)
TCO2: 25 mmol/L (ref 22–32)
pCO2, Ven: 44.5 mmHg (ref 44–60)
pH, Ven: 7.342 (ref 7.25–7.43)
pO2, Ven: 30 mmHg — CL (ref 32–45)

## 2023-12-03 LAB — CBC WITH DIFFERENTIAL/PLATELET
Abs Immature Granulocytes: 0.02 10*3/uL (ref 0.00–0.07)
Basophils Absolute: 0 10*3/uL (ref 0.0–0.1)
Basophils Relative: 0 %
Eosinophils Absolute: 0 10*3/uL (ref 0.0–0.5)
Eosinophils Relative: 1 %
HCT: 39.7 % (ref 39.0–52.0)
Hemoglobin: 13 g/dL (ref 13.0–17.0)
Immature Granulocytes: 0 %
Lymphocytes Relative: 15 %
Lymphs Abs: 0.7 10*3/uL (ref 0.7–4.0)
MCH: 29.5 pg (ref 26.0–34.0)
MCHC: 32.7 g/dL (ref 30.0–36.0)
MCV: 90.2 fL (ref 80.0–100.0)
Monocytes Absolute: 0.4 10*3/uL (ref 0.1–1.0)
Monocytes Relative: 8 %
Neutro Abs: 3.5 10*3/uL (ref 1.7–7.7)
Neutrophils Relative %: 76 %
Platelets: 232 10*3/uL (ref 150–400)
RBC: 4.4 MIL/uL (ref 4.22–5.81)
RDW: 13.8 % (ref 11.5–15.5)
WBC: 4.7 10*3/uL (ref 4.0–10.5)
nRBC: 0 % (ref 0.0–0.2)

## 2023-12-03 LAB — CBG MONITORING, ED: Glucose-Capillary: 190 mg/dL — ABNORMAL HIGH (ref 70–99)

## 2023-12-03 LAB — MAGNESIUM: Magnesium: 1.9 mg/dL (ref 1.7–2.4)

## 2023-12-03 LAB — LIPASE, BLOOD: Lipase: 18 U/L (ref 11–51)

## 2023-12-03 MED ORDER — LACTATED RINGERS IV BOLUS
1000.0000 mL | Freq: Once | INTRAVENOUS | Status: AC
  Administered 2023-12-03: 1000 mL via INTRAVENOUS

## 2023-12-03 NOTE — ED Triage Notes (Signed)
 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 Pt reports was worse last night. Pt reports he has been on abx but stopped yesterday d/t symptoms. CBG 242 and BP 170/114 per EMS, all other VSS.

## 2023-12-03 NOTE — Discharge Instructions (Signed)
 Your blood work overall looks good.  You are just dehydrated from not eating well and your diarrhea.  You can try yogurt or something with a probiotic to help with the diarrhea.  Use your 18 units of Lantus since you are not using your pump and then do a sliding scale insulin  with meals.  Follow-up with your infectious disease doctor tomorrow as planned to see if you can stop the antibiotics altogether.

## 2023-12-03 NOTE — ED Provider Notes (Signed)
 [facility] EMERGENCY DEPARTMENT AT [facility] Provider Note   CSN: [id number] Arrival date & time: 12/03/23  0749     History  Chief Complaint  Patient presents with   Weakness    Steven Figueroa is a 63 y.o. male.  Patient is a 63 year old male with a history of type 1 diabetes on an insulin  pump, hypertension, anemia who was recently admitted at the [facility] for foot amputation and infection who was discharged on 11/24/2023 on linezolid and Levaquin presenting today due to persistent diarrhea, generalized weakness and fluctuating blood sugar.  He reports since being out of the hospital on the ninth he has not felt well.  He reports that on his insulin  pump his blood sugar is dropping every night down to 50 at the lowest and he is having to wake up at 3 AM to eat.  He reports he is had a very poor appetite over the last few weeks and having diarrhea every 2 hours.  He has not had any fever, abdominal pain, chest pain.  He does feel generally weak and is only eating 1 time a day.  Patient gets the dressing changed on his foot 3 times a week and he just saw his podiatrist a few days ago and reports the wound has been healing well and there are no issues.  [facility] but he stopped taking the antibiotic yesterday because he just could not handle it anymore.  The history is provided by the patient and medical records.  Weakness      Home Medications Prior to Admission medications   Medication Sig Start Date End Date Taking? Authorizing Provider  acetaminophen  (TYLENOL ) 500 MG tablet Take 2 tablets (1,000 mg total) by mouth every 6 (six) hours. 03/21/23   Mallie Seal, MD  amLODipine (NORVASC) 10 MG tablet Take 10 mg by mouth daily. 11/08/19   [provider]  aspirin 81 MG EC tablet Take 81 mg by mouth daily. 04/30/20   [provider]  azaTHIOprine (IMURAN) 50 MG tablet Take 50 mg by mouth in the morning and at bedtime. 05/06/22    [provider]  azelastine  (ASTELIN ) 0.1 % nasal spray Place 1 spray into both nostrils 2 (two) times daily as needed for rhinitis. Use in each nostril as directed 04/05/23   Trudy Fusi, DO  Brinzolamide-Brimonidine 1-0.2 % SUSP Place 1 drop into both eyes in the morning, at noon, and at bedtime. 12/12/19   [provider]  capsaicin (ZOSTRIX) 0.025 % cream Apply 1 Application topically 2 (two) times daily. 09/08/20   [provider]  cetirizine  (ZYRTEC  ALLERGY ) 10 MG tablet Take 1 tablet (10 mg total) by mouth in the morning. 04/05/23   Trudy Fusi, DO  Cholecalciferol 50 MCG (2000 UT) TABS Take 2,000 Units by mouth daily. 11/26/19   [provider]  EPINEPHrine  0.3 mg/0.3 mL IJ SOAJ injection Inject 0.3 mg into the muscle as needed for anaphylaxis. 05/03/23   Trudy Fusi, DO  ferrous sulfate 325 (65 FE) MG tablet Take 325 mg by mouth every other day. 05/07/20   [provider]  gabapentin (NEURONTIN) 300 MG capsule Take 300 mg by mouth 3 (three) times daily. 01/14/20   [provider]  glucose blood test strip  09/17/20   [provider]  insulin  aspart (NOVOLOG ) 100 UNIT/ML injection Inject 8 Units into the skin 3 (three) times daily before meals.  [provider]  insulin  glargine (LANTUS) 100 UNIT/ML injection Inject 22 Units into the skin in the morning. 08/31/20   [provider]  latanoprost (XALATAN) 0.005 % ophthalmic solution Place 1 drop into both eyes at bedtime. 05/11/20   [provider]  losartan (COZAAR) 100 MG tablet Take 100 mg by mouth daily. 01/12/20   [provider]  Magnesium Oxide 420 MG TABS Take 420 mg by mouth in the morning and at bedtime. 04/30/20   [provider]  methazolamide (NEPTAZANE) 50 MG tablet Take 50 mg by mouth 2 (two) times daily. 06/18/20   [provider]  modafinil (PROVIGIL) 200 MG tablet Take 200 mg by mouth daily. 09/02/20   [provider]  montelukast  (SINGULAIR ) 10 MG tablet Take 1 tablet (10 mg total) by mouth at bedtime. 04/05/23   Trudy Fusi, DO  Multiple Vitamin (MULTIVITAMIN WITH MINERALS) TABS tablet Take 1 tablet by mouth daily.    [provider]  Omega-3 Fatty Acids (FISH OIL) 1000 MG CAPS Take 1,000 mg by mouth daily. 11/26/19   [provider]  pioglitazone (ACTOS) 45 MG tablet Take 45 mg by mouth daily. 11/26/19   [provider]  pravastatin (PRAVACHOL) 20 MG tablet Take 20 mg by mouth at bedtime. 11/26/19   [provider]  sulfamethoxazole -trimethoprim  (BACTRIM  DS) 800-160 MG tablet Take 1 tablet by mouth 2 (two) times daily. 03/21/23   Mallie Seal, MD  timolol (TIMOPTIC) 0.5 % ophthalmic solution Place 1 drop into both eyes 2 (two) times daily. 05/26/20   [provider]      Allergies    Codeine, Iodine, and Morphine    Review of Systems   Review of Systems  Neurological:  Positive for weakness.    Physical Exam Updated Vital Signs BP (!) 145/97   Pulse 72   Temp 98.8 F (37.1 C) (Oral)   Resp (!) 24   Ht 6\' 1"  (1.854 m)   Wt 97.1 kg   SpO2 98%   BMI 28.23 kg/m  Physical Exam  ED Results / Procedures / Treatments   Labs (all labs ordered are listed, but only abnormal results are displayed) Labs Reviewed  COMPREHENSIVE METABOLIC PANEL WITH GFR - Abnormal; Notable for the following components:      Result Value   Sodium 134 (*)    Glucose, Bld 227 (*)    Creatinine, Ser 1.36 (*)    GFR, Estimated 58 (*)    All other components within normal limits  URINALYSIS, W/ REFLEX TO CULTURE (INFECTION SUSPECTED) - Abnormal; Notable for the following components:   Color, Urine STRAW (*)    Glucose, UA >=500 (*)    All other components within normal limits  I-STAT VENOUS BLOOD GAS, ED - Abnormal; Notable for the following components:   pO2, Ven 30 (*)    All other components within normal limits  CBG MONITORING, ED - Abnormal; Notable for  the following components:   Glucose-Capillary 190 (*)    All other components within normal limits  CBC WITH DIFFERENTIAL/PLATELET  LIPASE, BLOOD  MAGNESIUM    EKG EKG Interpretation Date/Time:  Sunday Dec 03 2023 08:53:11 EDT Ventricular Rate:  85 PR Interval:  179 QRS Duration:  82 QT Interval:  347 QTC Calculation: 413 R Axis:   79  Text Interpretation: Sinus rhythm Normal ECG Confirmed by Almond Army ([id number]) on 12/03/2023 9:05:29 AM  Radiology No results found.  Procedures Procedures    Medications  Ordered in ED Medications  lactated ringers  bolus 1,000 mL (0 mLs Intravenous Stopped 12/03/23 1208)    ED Course/ Medical Decision Making/ A&P                                 Medical Decision Making Amount and/or Complexity of Data Reviewed Labs: ordered. Decision-making details documented in ED Course. ECG/medicine tests: ordered and independent interpretation performed. Decision-making details documented in ED Course.   Pt with multiple medical problems and comorbidities and presenting today with a complaint that caries a high risk for morbidity and mortality.  Here today with the above complaints and concern for electrolyte abnormalities, AKI, dehydration.  Lower suspicion for DKA as patient's blood sugar has been lower.  He did take off his insulin  pump yesterday because he kept dropping and he has just been using sliding scale.  He last used 3 units of insulin  at 3 AM and did drink a V8 this morning.  Low suspicion for infection at this time as patient has not been febrile has no abdominal pain or urinary symptoms.  He is afebrile here and vital signs are stable.  No signs of sepsis.  Patient given IV fluids.  Labs are pending.  I independently interpreted patient's EKG which was normal.  12:54 PM I independently interpreted patient's labs and CBC within normal limits, CMP without acute findings, lipase within normal limits, VBG with normal pH and bicarb,  magnesium is normal.  Blood sugar after fluid bolus Suspect patient's symptoms are more related to poor oral intake and diarrhea related to the antibiotics.  He discontinued them yesterday and has follow-up with ID tomorrow.  Will have patient continue sliding scale insulin  or resume his insulin  pump if he is starting to eat more and feeling better.  Feel that his sugar would be better controlled as long as he was giving a basal rate. UA without findings.  Patient does have Lantus at home that he can use 18 units and then he will use a sliding scale.  He and his wife are comfortable with this plan and he will follow-up tomorrow         Final Clinical Impression(s) / ED Diagnoses Final diagnoses:  Weakness  Dehydration    Rx / DC Orders ED Discharge Orders     None         Almond Army, MD 12/03/23 1254

## 2023-12-05 ENCOUNTER — Ambulatory Visit (INDEPENDENT_AMBULATORY_CARE_PROVIDER_SITE_OTHER)

## 2023-12-05 DIAGNOSIS — J309 Allergic rhinitis, unspecified: Secondary | ICD-10-CM | POA: Diagnosis not present

## 2023-12-19 ENCOUNTER — Ambulatory Visit (INDEPENDENT_AMBULATORY_CARE_PROVIDER_SITE_OTHER)

## 2023-12-19 DIAGNOSIS — J309 Allergic rhinitis, unspecified: Secondary | ICD-10-CM

## 2023-12-27 ENCOUNTER — Ambulatory Visit (INDEPENDENT_AMBULATORY_CARE_PROVIDER_SITE_OTHER): Payer: Self-pay

## 2023-12-27 DIAGNOSIS — J309 Allergic rhinitis, unspecified: Secondary | ICD-10-CM

## 2023-12-28 ENCOUNTER — Ambulatory Visit: Admitting: Family Medicine

## 2024-01-03 NOTE — Progress Notes (Signed)
   [address] [place] [address] Dept: [phone]  FOLLOW UP NOTE  Patient ID: Steven Figueroa, male    DOB: June 03, 1961  Age: 63 y.o. MRN: [id number] Date of Office Visit: 01/04/2024  Assessment  Chief Complaint: No chief complaint on file.  HPI Steven Figueroa is a 63 year old male who presents to the clinic for follow-up visit.  He was last seen in this clinic on 05/03/2023 by Dr. Burdette Carolin for evaluation of allergic rhinitis and possible food allergy .  He began allergen immunotherapy directed toward grass pollen, weed pollen, ragweed pollen, tree pollen, mold, and dust mite on 04/26/2023.  Discussed the use of AI scribe software for clinical note transcription with the patient, who gave verbal consent to proceed.  History of Present Illness      Drug Allergies:  Allergies  Allergen Reactions   Codeine Itching   Iodine     Pt states he's no longer allergic to iodine   Morphine Itching    Physical Exam: There were no vitals taken for this visit.   Physical Exam  Diagnostics:    Assessment and Plan: No diagnosis found.  No orders of the defined types were placed in this encounter.   There are no Patient Instructions on file for this visit.  No follow-ups on file.    Thank you for the opportunity to care for this patient.  Please do not hesitate to contact me with questions.  Marinus Sic, FNP [facility]

## 2024-01-03 NOTE — Patient Instructions (Signed)
 Allergic rhinitis Continue allergen avoidance measures directed toward grass pollen, weed pollen, ragweed pollen, tree pollen, dust mite, and mold as listed below Continue cetirizine  10 mg once a day if needed for runny nose or itch Continue montelukast  10 mg once a day to control allergy  symptoms Continue azelastine  2 sprays in each nostril once or twice a day if needed for a runny nose Consider saline nasal rinses as needed for nasal symptoms. Use this before any medicated nasal sprays for best result Continue allergen immunotherapy and have access to an epinephrine  autoinjector set  Call the clinic if this treatment plan is not working well for you.  Follow up in 1 year or sooner if needed.  Reducing Pollen Exposure The American Academy of All[employer] , Asthma and Immun[employer] suggests the following steps to reduce your exposure to pollen during allergy  seasons. Do not hang sheets or clothing out to dry; pollen may collect on these items. Do not mow lawns or spend time around freshly cut grass; mowing stirs up pollen. Keep windows closed at night.  Keep car windows closed while driving. Minimize morning activities outdoors, a time when pollen counts are usually at their highest. Stay indoors as much as possible when pollen counts or humidity is high and on windy days when pollen tends to remain in the air longer. Use air conditioning when possible.  Many air conditioners have filters that trap the pollen spores. Use a HEPA room air filter to remove pollen form the indoor air you breathe.  Control of Mold Allergen Mold and fungi can grow on a variety of surfaces provided certain temperature and moisture conditions exist.  Outdoor molds grow on plants, decaying vegetation and soil.  The major outdoor mold, Alternaria and Cladosporium, are found in very high numbers during hot and dry conditions.  Generally, a late Summer - Fall peak is seen for common outdoor fungal spores.  Rain will temporarily  lower outdoor mold spore count, but counts rise rapidly when the rainy period ends.  The most important indoor molds are Aspergillus and Penicillium.  Dark, humid and poorly ventilated basements are ideal sites for mold growth.  The next most common sites of mold growth are the bathroom and the kitchen.  Outdoor [employer] Use air conditioning and keep windows closed Avoid exposure to decaying vegetation. Avoid leaf raking. Avoid grain handling. Consider wearing a face mask if working in moldy areas.  Indoor Mold Control Maintain humidity below 50%. Clean washable surfaces with 5% bleach solution. Remove sources e.g. Contaminated carpets.   Control of Dust Mite Allergen Dust mites play a major role in allergic asthma and rhinitis. They occur in environments with high humidity wherever human skin is found. Dust mites absorb humidity from the atmosphere (ie, they do not drink) and feed on organic matter (including shed human and animal skin). Dust mites are a microscopic type of insect that you cannot see with the naked eye. High levels of dust mites have been detected from mattresses, pillows, carpets, upholstered furniture, bed covers, clothes, soft toys and any woven material. The principal allergen of the dust mite is found in its feces. A gram of dust may contain 1,000 mites and 250,000 fecal particles. Mite antigen is easily measured in the air during house cleaning activities. Dust mites do not bite and do not cause harm to humans, other than by triggering allergies/asthma.  Ways to decrease your exposure to dust mites in your home:  1. Encase mattresses, box springs and pillows with a  mite-impermeable barrier or cover  2. Wash sheets, blankets and drapes weekly in hot water  (130 F) with detergent and dry them in a dryer on the hot setting.  3. Have the room cleaned frequently with a vacuum cleaner and a damp dust-mop. For carpeting or rugs, vacuuming with a vacuum cleaner equipped  with a high-efficiency particulate air (HEPA) filter. The dust mite allergic individual should not be in a room which is being cleaned and should wait 1 hour after cleaning before going into the room.  4. Do not sleep on upholstered furniture (eg, couches).  5. If possible removing carpeting, upholstered furniture and drapery from the home is ideal. Horizontal blinds should be eliminated in the rooms where the person spends the most time (bedroom, study, television room). Washable vinyl, roller-type shades are optimal.  6. Remove all non-washable stuffed toys from the bedroom. Wash stuffed toys weekly like sheets and blankets above.  7. Reduce indoor humidity to less than 50%. Inexpensive humidity monitors can be purchased at most hardware stores. Do not use a humidifier as can make the problem worse and are not recommended.

## 2024-01-04 ENCOUNTER — Encounter: Payer: Self-pay | Admitting: Family Medicine

## 2024-01-04 ENCOUNTER — Ambulatory Visit (INDEPENDENT_AMBULATORY_CARE_PROVIDER_SITE_OTHER): Admitting: Family Medicine

## 2024-01-04 ENCOUNTER — Other Ambulatory Visit: Payer: Self-pay

## 2024-01-04 VITALS — BP 110/70 | HR 95 | Temp 97.7°F | Resp 18 | Ht 73.0 in | Wt 211.5 lb

## 2024-01-04 DIAGNOSIS — J3089 Other allergic rhinitis: Secondary | ICD-10-CM

## 2024-01-04 DIAGNOSIS — J302 Other seasonal allergic rhinitis: Secondary | ICD-10-CM | POA: Insufficient documentation

## 2024-01-09 ENCOUNTER — Ambulatory Visit (INDEPENDENT_AMBULATORY_CARE_PROVIDER_SITE_OTHER)

## 2024-01-09 DIAGNOSIS — J309 Allergic rhinitis, unspecified: Secondary | ICD-10-CM | POA: Diagnosis not present

## 2024-01-16 ENCOUNTER — Ambulatory Visit (INDEPENDENT_AMBULATORY_CARE_PROVIDER_SITE_OTHER)

## 2024-01-16 DIAGNOSIS — J309 Allergic rhinitis, unspecified: Secondary | ICD-10-CM | POA: Diagnosis not present

## 2024-01-30 ENCOUNTER — Ambulatory Visit (INDEPENDENT_AMBULATORY_CARE_PROVIDER_SITE_OTHER)

## 2024-01-30 DIAGNOSIS — J309 Allergic rhinitis, unspecified: Secondary | ICD-10-CM

## 2024-02-09 ENCOUNTER — Ambulatory Visit (INDEPENDENT_AMBULATORY_CARE_PROVIDER_SITE_OTHER)

## 2024-02-09 DIAGNOSIS — J309 Allergic rhinitis, unspecified: Secondary | ICD-10-CM

## 2024-02-14 ENCOUNTER — Ambulatory Visit (INDEPENDENT_AMBULATORY_CARE_PROVIDER_SITE_OTHER)

## 2024-02-14 DIAGNOSIS — J309 Allergic rhinitis, unspecified: Secondary | ICD-10-CM

## 2024-02-23 ENCOUNTER — Ambulatory Visit (INDEPENDENT_AMBULATORY_CARE_PROVIDER_SITE_OTHER)

## 2024-02-23 DIAGNOSIS — J309 Allergic rhinitis, unspecified: Secondary | ICD-10-CM | POA: Diagnosis not present

## 2024-02-28 ENCOUNTER — Ambulatory Visit

## 2024-02-28 DIAGNOSIS — J309 Allergic rhinitis, unspecified: Secondary | ICD-10-CM | POA: Diagnosis not present

## 2024-03-04 DIAGNOSIS — J302 Other seasonal allergic rhinitis: Secondary | ICD-10-CM | POA: Diagnosis not present

## 2024-03-04 DIAGNOSIS — J3089 Other allergic rhinitis: Secondary | ICD-10-CM | POA: Diagnosis not present

## 2024-03-04 NOTE — Progress Notes (Signed)
 VIALS MADE ON 03/04/24

## 2024-03-05 DIAGNOSIS — J301 Allergic rhinitis due to pollen: Secondary | ICD-10-CM | POA: Diagnosis not present

## 2024-03-07 ENCOUNTER — Ambulatory Visit (INDEPENDENT_AMBULATORY_CARE_PROVIDER_SITE_OTHER)

## 2024-03-07 DIAGNOSIS — J309 Allergic rhinitis, unspecified: Secondary | ICD-10-CM

## 2024-03-14 ENCOUNTER — Ambulatory Visit (INDEPENDENT_AMBULATORY_CARE_PROVIDER_SITE_OTHER)

## 2024-03-14 DIAGNOSIS — J309 Allergic rhinitis, unspecified: Secondary | ICD-10-CM

## 2024-03-22 ENCOUNTER — Ambulatory Visit (INDEPENDENT_AMBULATORY_CARE_PROVIDER_SITE_OTHER)

## 2024-03-22 DIAGNOSIS — J309 Allergic rhinitis, unspecified: Secondary | ICD-10-CM

## 2024-03-29 ENCOUNTER — Ambulatory Visit

## 2024-03-29 DIAGNOSIS — J309 Allergic rhinitis, unspecified: Secondary | ICD-10-CM | POA: Diagnosis not present

## 2024-04-05 ENCOUNTER — Ambulatory Visit

## 2024-04-05 DIAGNOSIS — J309 Allergic rhinitis, unspecified: Secondary | ICD-10-CM | POA: Diagnosis not present

## 2024-04-18 ENCOUNTER — Ambulatory Visit

## 2024-04-18 DIAGNOSIS — J309 Allergic rhinitis, unspecified: Secondary | ICD-10-CM | POA: Diagnosis not present

## 2024-05-02 ENCOUNTER — Ambulatory Visit (INDEPENDENT_AMBULATORY_CARE_PROVIDER_SITE_OTHER)

## 2024-05-02 DIAGNOSIS — J309 Allergic rhinitis, unspecified: Secondary | ICD-10-CM

## 2024-05-16 ENCOUNTER — Ambulatory Visit

## 2024-05-16 DIAGNOSIS — J309 Allergic rhinitis, unspecified: Secondary | ICD-10-CM

## 2024-05-29 ENCOUNTER — Ambulatory Visit

## 2024-05-29 DIAGNOSIS — J309 Allergic rhinitis, unspecified: Secondary | ICD-10-CM | POA: Diagnosis not present

## 2024-06-17 ENCOUNTER — Ambulatory Visit

## 2024-06-17 DIAGNOSIS — J309 Allergic rhinitis, unspecified: Secondary | ICD-10-CM

## 2024-06-26 DIAGNOSIS — J3089 Other allergic rhinitis: Secondary | ICD-10-CM | POA: Diagnosis not present

## 2024-06-26 DIAGNOSIS — J302 Other seasonal allergic rhinitis: Secondary | ICD-10-CM | POA: Diagnosis not present

## 2024-06-26 NOTE — Progress Notes (Signed)
 VIALS MADE ON 06/26/24

## 2024-06-27 DIAGNOSIS — J301 Allergic rhinitis due to pollen: Secondary | ICD-10-CM | POA: Diagnosis not present

## 2024-07-04 ENCOUNTER — Ambulatory Visit (INDEPENDENT_AMBULATORY_CARE_PROVIDER_SITE_OTHER)

## 2024-07-04 DIAGNOSIS — J309 Allergic rhinitis, unspecified: Secondary | ICD-10-CM

## 2024-07-22 DIAGNOSIS — J309 Allergic rhinitis, unspecified: Secondary | ICD-10-CM | POA: Diagnosis not present

## 2024-08-06 ENCOUNTER — Ambulatory Visit (INDEPENDENT_AMBULATORY_CARE_PROVIDER_SITE_OTHER)

## 2024-08-06 DIAGNOSIS — J302 Other seasonal allergic rhinitis: Secondary | ICD-10-CM

## 2024-08-06 DIAGNOSIS — J3089 Other allergic rhinitis: Secondary | ICD-10-CM

## 2024-08-22 ENCOUNTER — Ambulatory Visit

## 2024-08-22 DIAGNOSIS — J302 Other seasonal allergic rhinitis: Secondary | ICD-10-CM

## 2025-01-06 ENCOUNTER — Ambulatory Visit: Admitting: Allergy
